# Patient Record
Sex: Female | Born: 1980 | Race: White | Hispanic: No | Smoking: Former smoker
Health system: Southern US, Community
[De-identification: ages and names within clinical notes are randomized; demographics above are authoritative.]

## PROBLEM LIST (undated history)

## (undated) ENCOUNTER — Inpatient Hospital Stay (HOSPITAL_COMMUNITY): Payer: Self-pay

## (undated) DIAGNOSIS — Z8619 Personal history of other infectious and parasitic diseases: Secondary | ICD-10-CM

## (undated) DIAGNOSIS — A499 Bacterial infection, unspecified: Secondary | ICD-10-CM

## (undated) DIAGNOSIS — F32A Depression, unspecified: Secondary | ICD-10-CM

## (undated) DIAGNOSIS — F329 Major depressive disorder, single episode, unspecified: Secondary | ICD-10-CM

## (undated) DIAGNOSIS — B009 Herpesviral infection, unspecified: Secondary | ICD-10-CM

## (undated) DIAGNOSIS — F4310 Post-traumatic stress disorder, unspecified: Secondary | ICD-10-CM

## (undated) DIAGNOSIS — B999 Unspecified infectious disease: Secondary | ICD-10-CM

## (undated) DIAGNOSIS — F419 Anxiety disorder, unspecified: Secondary | ICD-10-CM

## (undated) DIAGNOSIS — A599 Trichomoniasis, unspecified: Secondary | ICD-10-CM

## (undated) DIAGNOSIS — K8020 Calculus of gallbladder without cholecystitis without obstruction: Secondary | ICD-10-CM

## (undated) DIAGNOSIS — B379 Candidiasis, unspecified: Secondary | ICD-10-CM

## (undated) DIAGNOSIS — F319 Bipolar disorder, unspecified: Secondary | ICD-10-CM

## (undated) DIAGNOSIS — T7491XA Unspecified adult maltreatment, confirmed, initial encounter: Secondary | ICD-10-CM

## (undated) DIAGNOSIS — IMO0002 Reserved for concepts with insufficient information to code with codable children: Secondary | ICD-10-CM

## (undated) DIAGNOSIS — L309 Dermatitis, unspecified: Secondary | ICD-10-CM

## (undated) DIAGNOSIS — N390 Urinary tract infection, site not specified: Secondary | ICD-10-CM

## (undated) HISTORY — DX: Herpesviral infection, unspecified: B00.9

## (undated) HISTORY — DX: Unspecified infectious disease: B99.9

## (undated) HISTORY — PX: ECTOPIC PREGNANCY SURGERY: SHX613

## (undated) HISTORY — PX: CHOLECYSTECTOMY: SHX55

## (undated) HISTORY — DX: Unspecified adult maltreatment, confirmed, initial encounter: T74.91XA

## (undated) HISTORY — PX: COSMETIC SURGERY: SHX468

## (undated) HISTORY — DX: Trichomoniasis, unspecified: A59.9

## (undated) HISTORY — DX: Bacterial infection, unspecified: A49.9

## (undated) HISTORY — DX: Bipolar disorder, unspecified: F31.9

## (undated) HISTORY — DX: Post-traumatic stress disorder, unspecified: F43.10

## (undated) HISTORY — DX: Reserved for concepts with insufficient information to code with codable children: IMO0002

## (undated) HISTORY — DX: Calculus of gallbladder without cholecystitis without obstruction: K80.20

## (undated) HISTORY — DX: Urinary tract infection, site not specified: N39.0

## (undated) HISTORY — DX: Candidiasis, unspecified: B37.9

## (undated) HISTORY — PX: WISDOM TOOTH EXTRACTION: SHX21

## (undated) HISTORY — DX: Dermatitis, unspecified: L30.9

## (undated) HISTORY — DX: Anxiety disorder, unspecified: F41.9

## (undated) HISTORY — DX: Personal history of other infectious and parasitic diseases: Z86.19

## (undated) HISTORY — PX: OTHER SURGICAL HISTORY: SHX169

---
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
## 2008-04-20 ENCOUNTER — Ambulatory Visit (HOSPITAL_COMMUNITY): Admission: RE | Admit: 2008-04-20 | Discharge: 2008-04-20 | Payer: Self-pay | Admitting: Family Medicine

## 2008-05-11 ENCOUNTER — Ambulatory Visit (HOSPITAL_COMMUNITY): Admission: RE | Admit: 2008-05-11 | Discharge: 2008-05-11 | Payer: Self-pay | Admitting: Family Medicine

## 2008-05-26 ENCOUNTER — Ambulatory Visit (HOSPITAL_COMMUNITY): Admission: RE | Admit: 2008-05-26 | Discharge: 2008-05-26 | Payer: Self-pay | Admitting: Family Medicine

## 2008-06-05 ENCOUNTER — Inpatient Hospital Stay (HOSPITAL_COMMUNITY): Admission: AD | Admit: 2008-06-05 | Discharge: 2008-06-05 | Payer: Self-pay | Admitting: Obstetrics & Gynecology

## 2008-06-07 ENCOUNTER — Ambulatory Visit (HOSPITAL_COMMUNITY): Admission: RE | Admit: 2008-06-07 | Discharge: 2008-06-07 | Payer: Self-pay | Admitting: Obstetrics and Gynecology

## 2008-07-16 ENCOUNTER — Inpatient Hospital Stay (HOSPITAL_COMMUNITY): Admission: AD | Admit: 2008-07-16 | Discharge: 2008-07-16 | Payer: Self-pay | Admitting: Obstetrics and Gynecology

## 2008-08-30 ENCOUNTER — Inpatient Hospital Stay (HOSPITAL_COMMUNITY): Admission: AD | Admit: 2008-08-30 | Discharge: 2008-09-01 | Payer: Self-pay | Admitting: Obstetrics and Gynecology

## 2008-09-12 ENCOUNTER — Inpatient Hospital Stay (HOSPITAL_COMMUNITY): Admission: AD | Admit: 2008-09-12 | Discharge: 2008-09-15 | Payer: Self-pay | Admitting: Obstetrics and Gynecology

## 2008-11-02 ENCOUNTER — Inpatient Hospital Stay (HOSPITAL_COMMUNITY): Admission: AD | Admit: 2008-11-02 | Discharge: 2008-11-04 | Payer: Self-pay | Admitting: Obstetrics and Gynecology

## 2008-12-28 IMAGING — US US ABDOMEN COMPLETE
1 series · 14 of 25 positions shown · non-contrast
Comparison: None

CLINICAL DATA: Abdomen pain.  Gallstones.

ABDOMEN ULTRASOUND
TECHNIQUE: Complete abdominal ultrasound examination was performed
including evaluation of the liver, gallbladder, bile ducts,
pancreas, kidneys, spleen, IVC, and abdominal aorta.

[Series 1: unknown · 0.33mm/px · 14 of 70 slices shown]
[im 1/70]
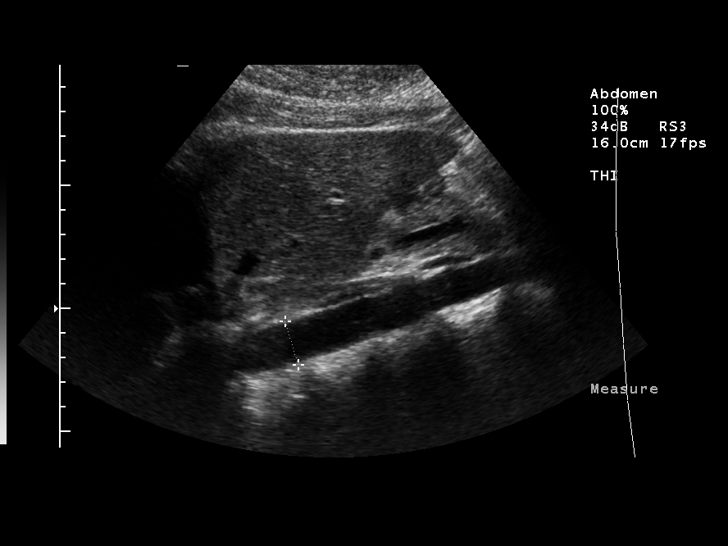
[im 6/70]
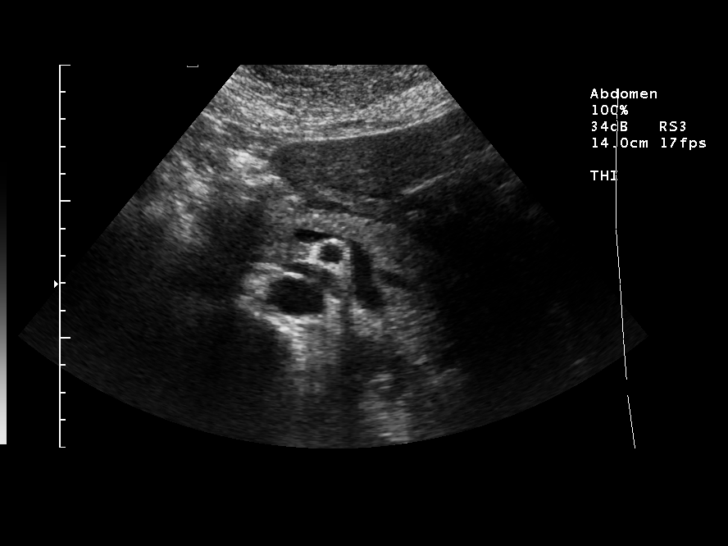
[im 12/70]
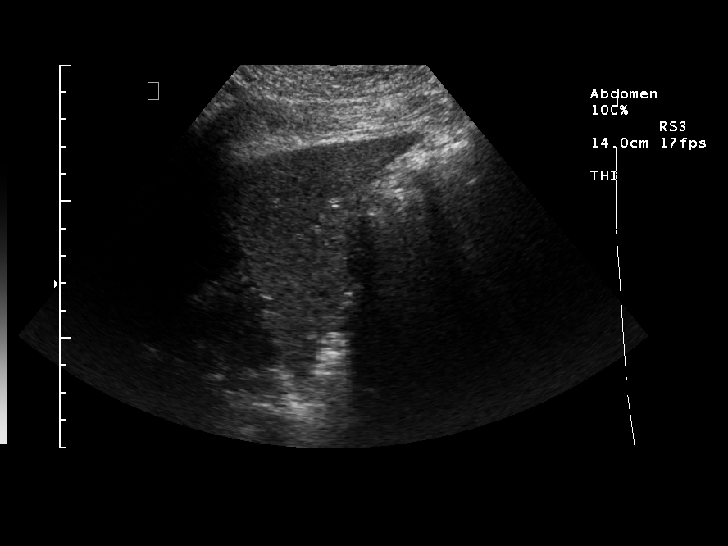
[im 18/70]
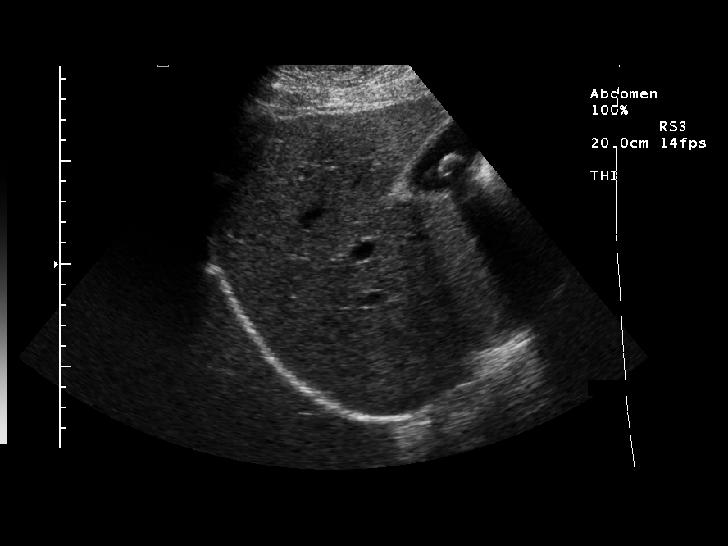
[im 24/70]
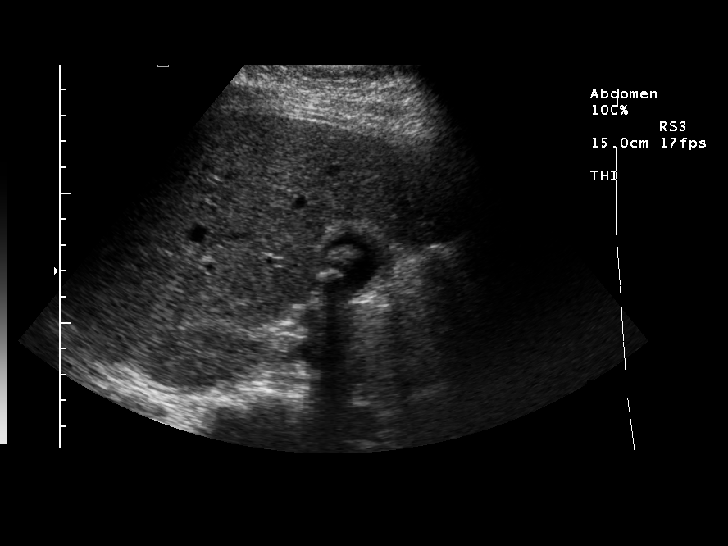
[im 26/70]
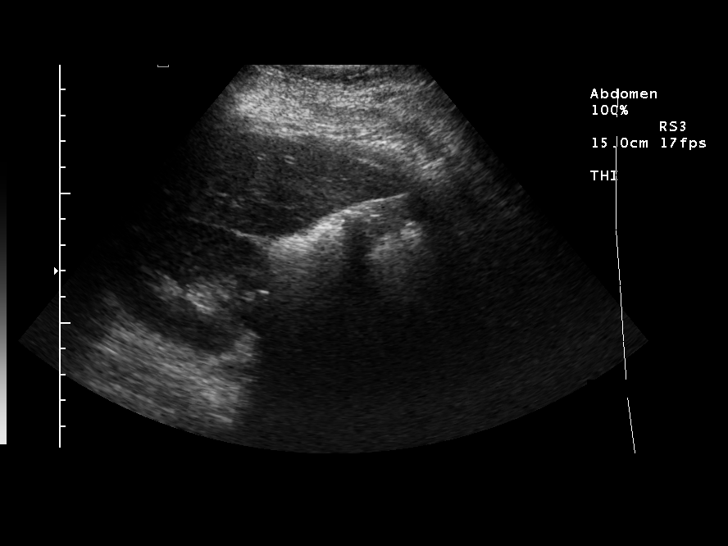
[im 32/70]
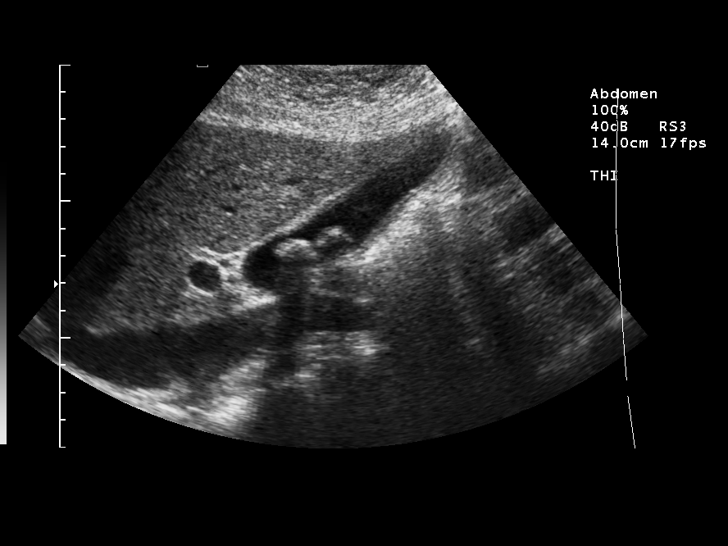
[im 38/70]
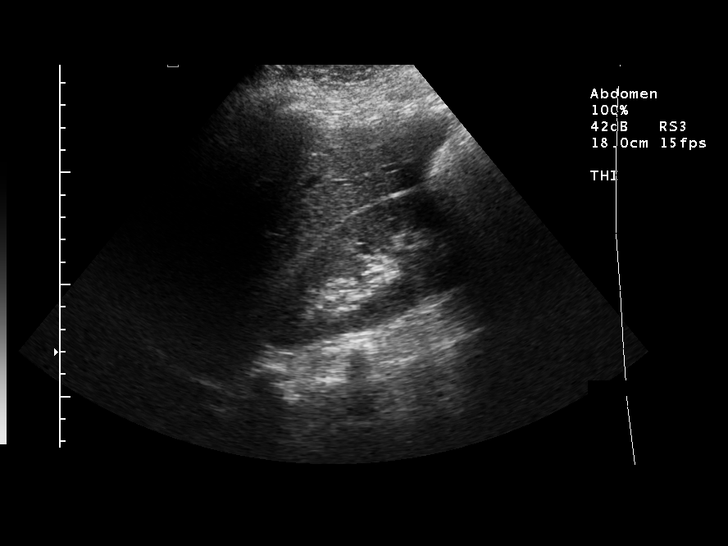
[im 44/70]
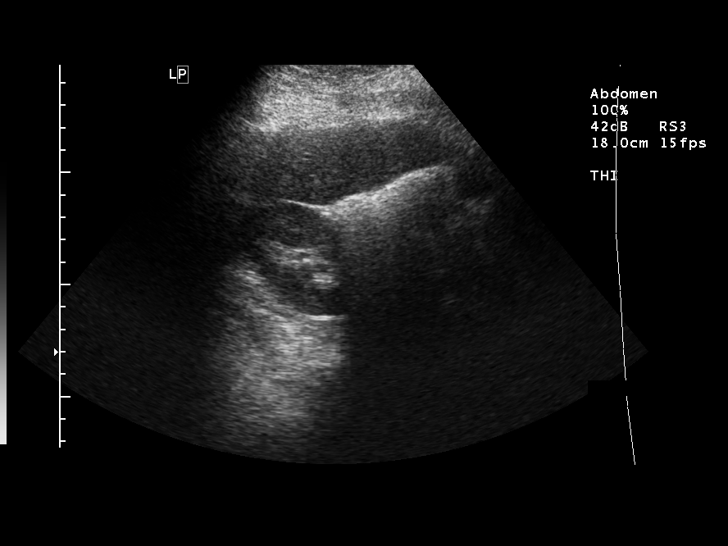
[im 47/70]
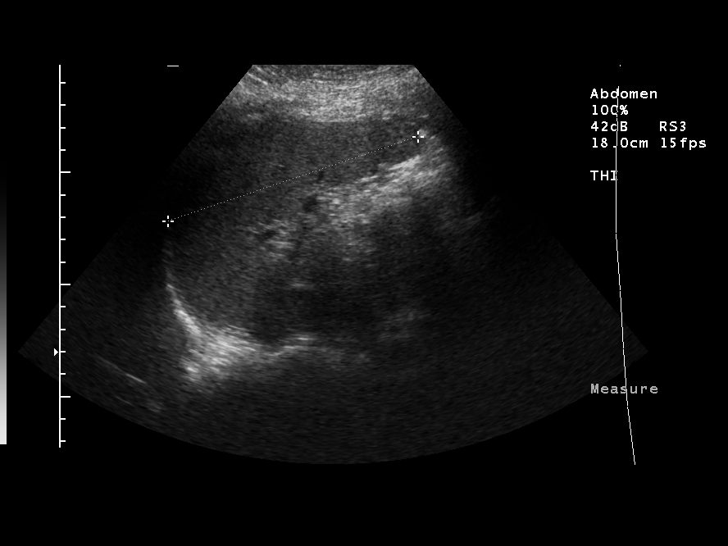
[im 52/70]
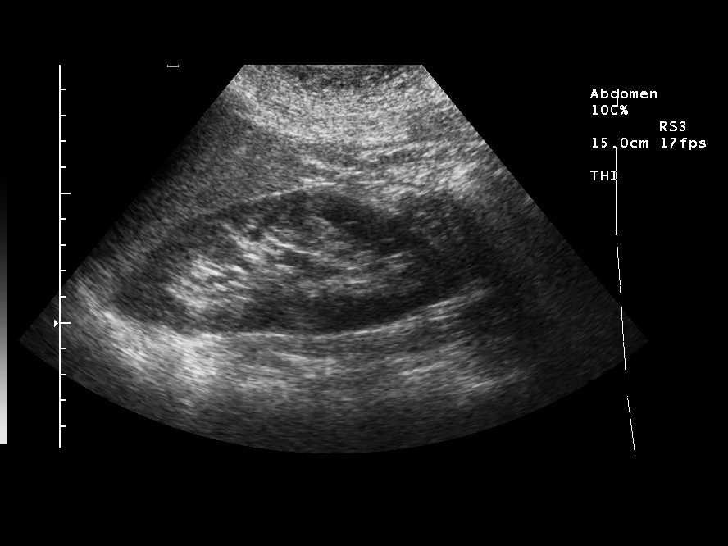
[im 58/70]
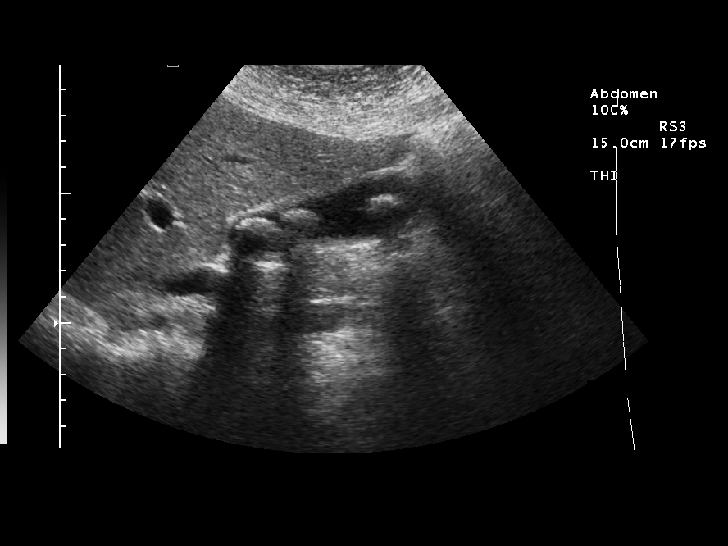
[im 64/70]
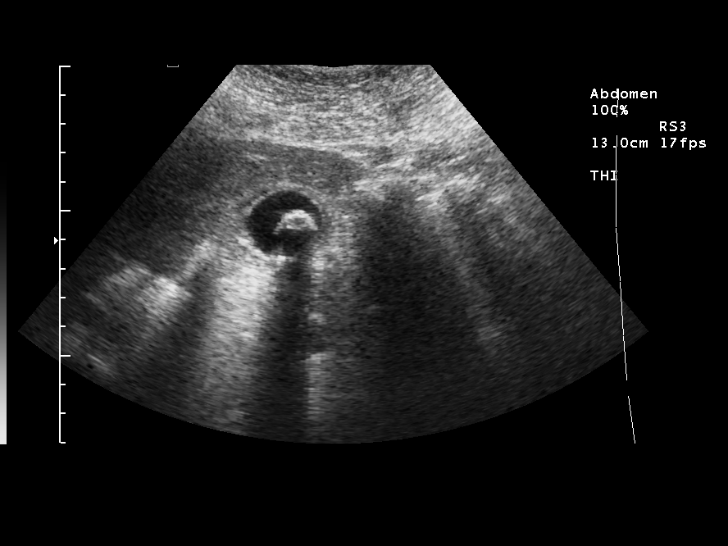
[im 70/70]
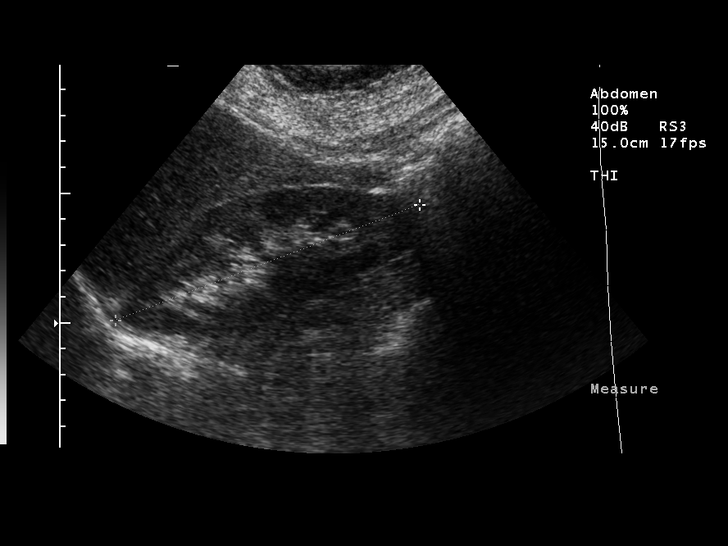

[14 of 25 positions shown; findings below may reference images not displayed]

FINDINGS: Multiple gallstones are present measuring up to 15 mm.
The gallbladder wall is mildly thickened at 4.4 mm.  There is no
pericholecystic fluid.  Common bile duct is normal at 3.0 mm.

The liver, IVC, pancreas, spleen, kidneys, and aorta are normal.
IMPRESSION: Multiple gallstones.  There is mild gallbladder wall thickening
which may indicate cholecystitis.  There is no biliary obstruction.

## 2009-03-22 IMAGING — US US ABDOMEN LIMITED
1 series · 14 of 22 positions shown · non-contrast
Comparison: [HOSPITAL] upper abdominal ultrasound
06/07/2008 and [HOSPITAL] obstetrical
ultrasound 05/26/2008.

CLINICAL DATA: Cholelithiasis.  30 weeks gestational age.

LIMITED ABDOMINAL ULTRASOUND - RIGHT UPPER QUADRANT

[Series 1: us abdomen limited · 14 of 22 slices shown]
[im 1/22]
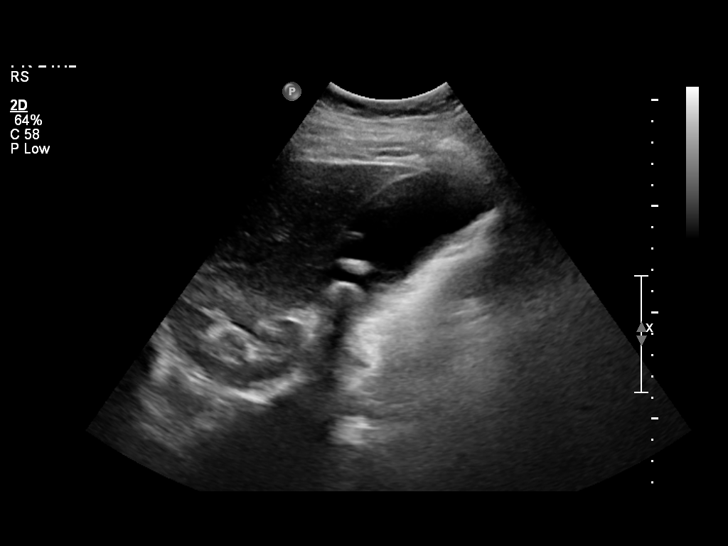
[im 3/22]
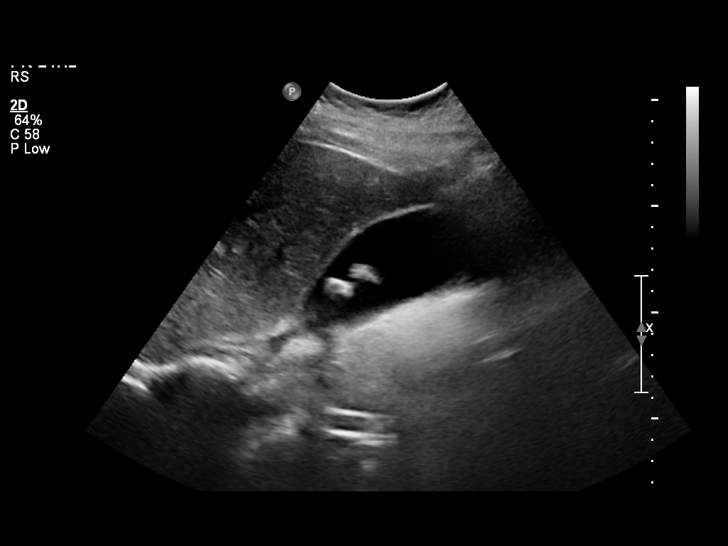
[im 4/22]
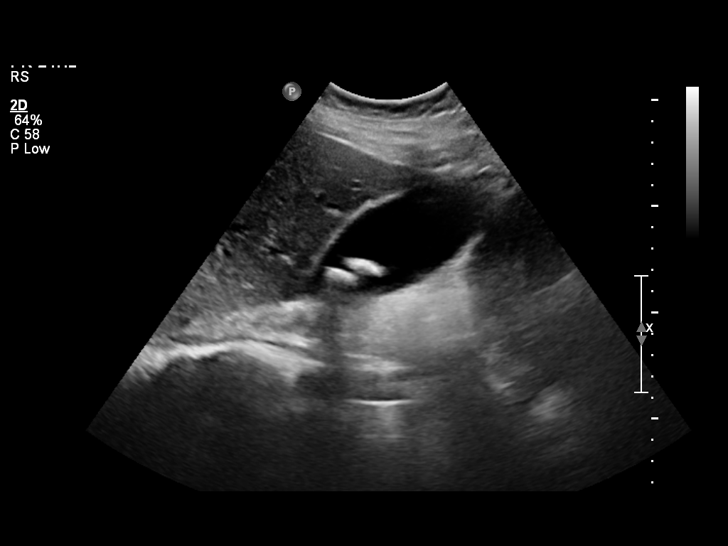
[im 6/22]
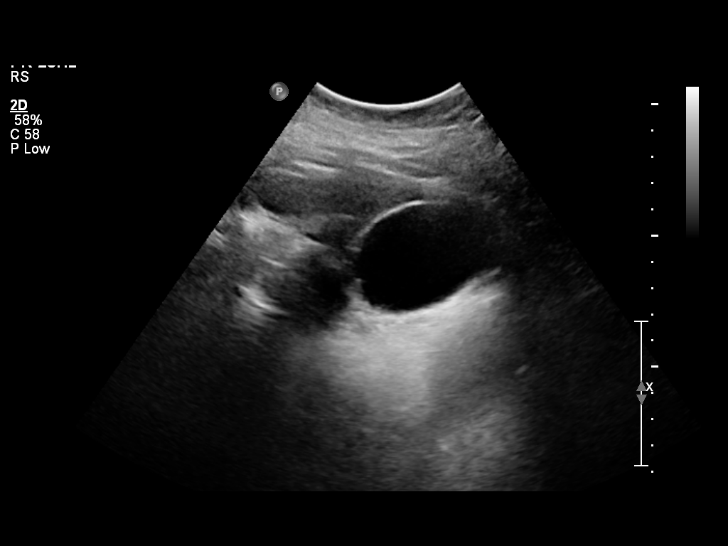
[im 8/22]
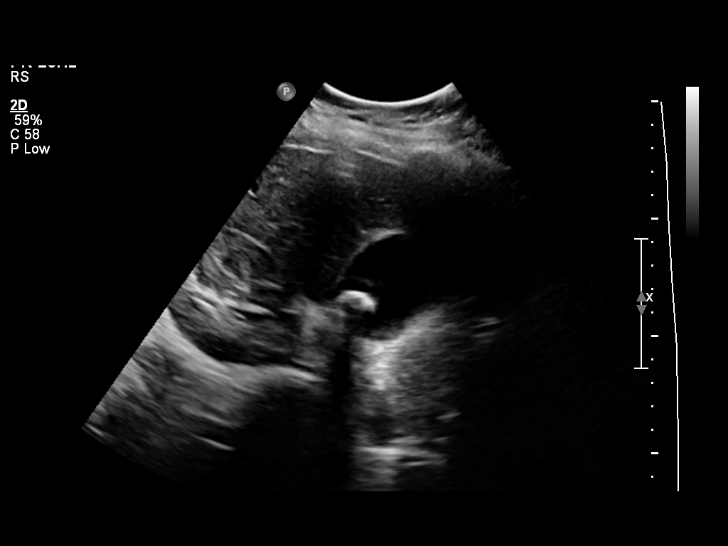
[im 9/22]
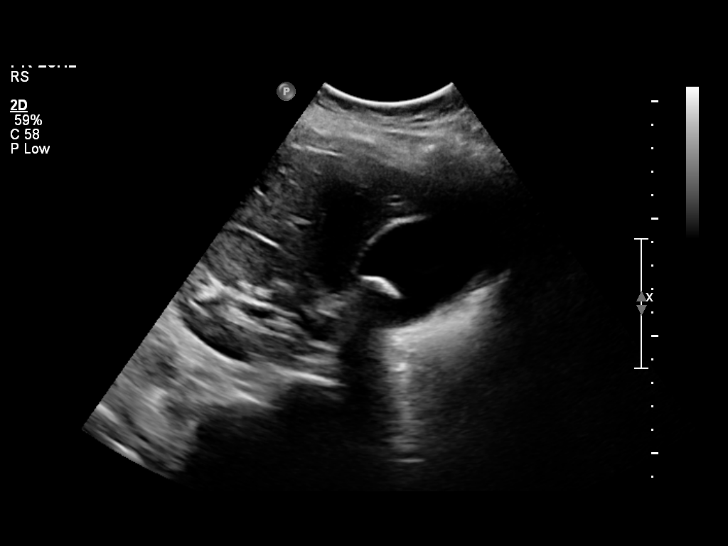
[im 11/22]
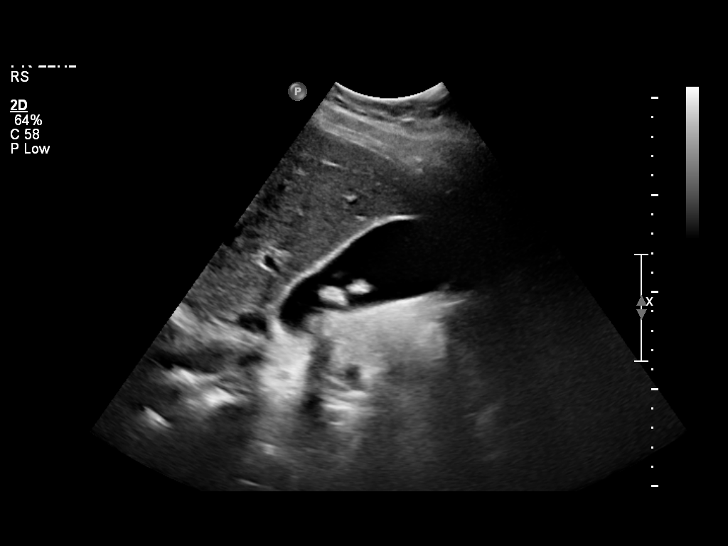
[im 12/22]
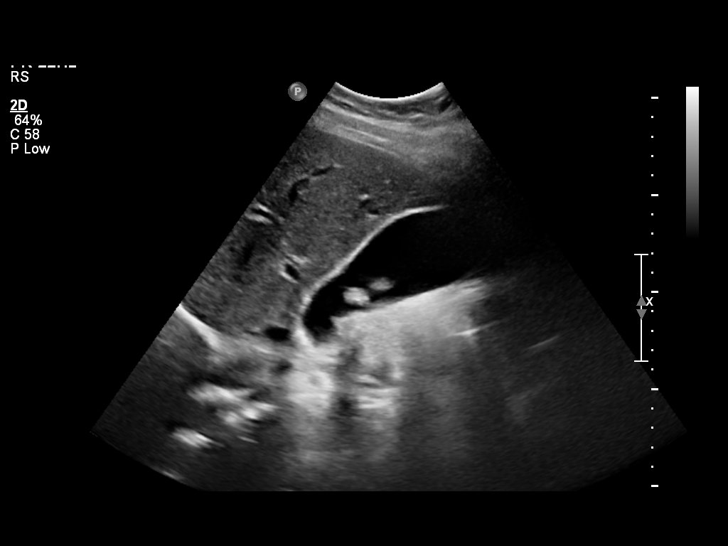
[im 14/22]
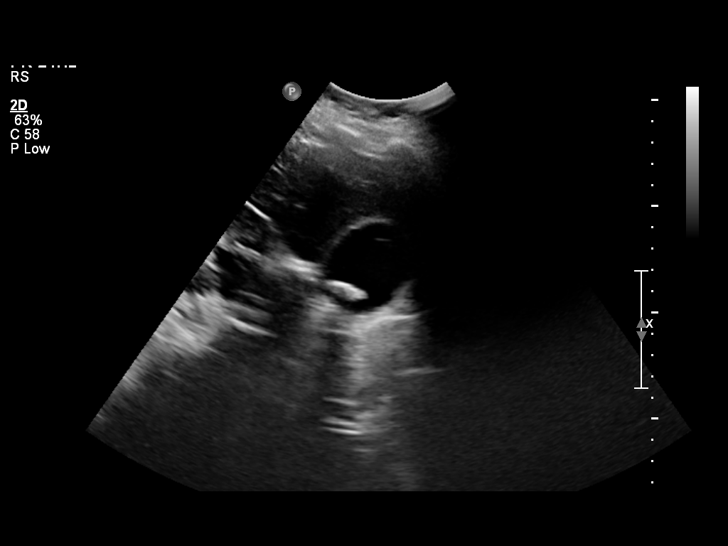
[im 15/22]
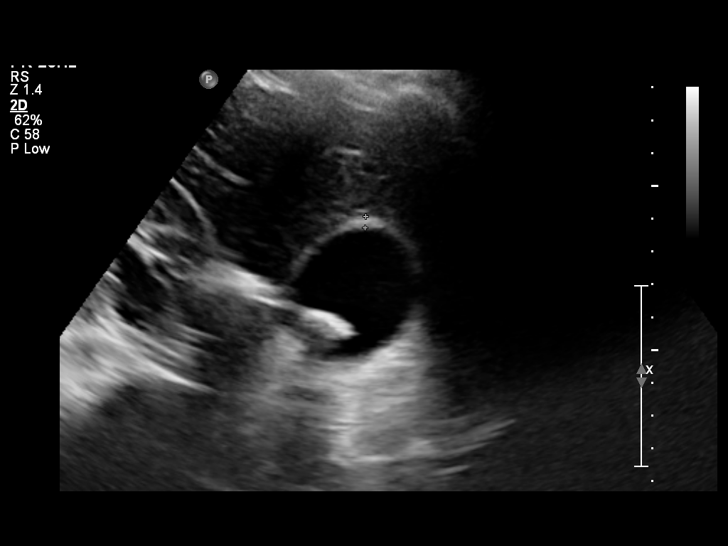
[im 17/22]
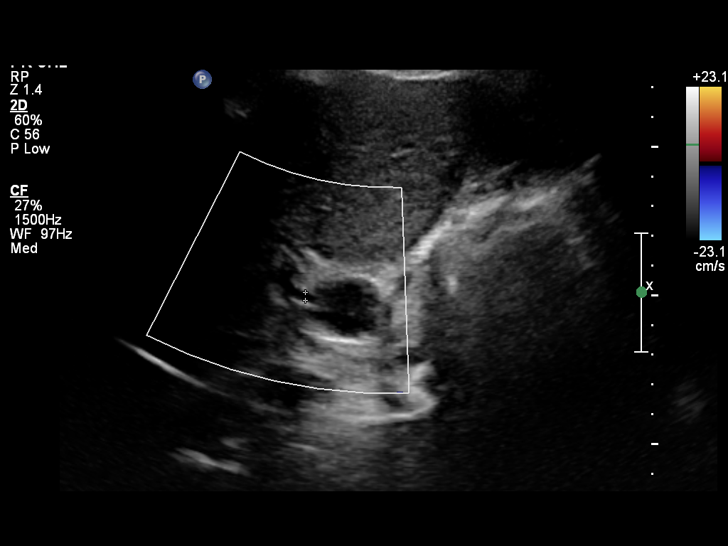
[im 19/22]
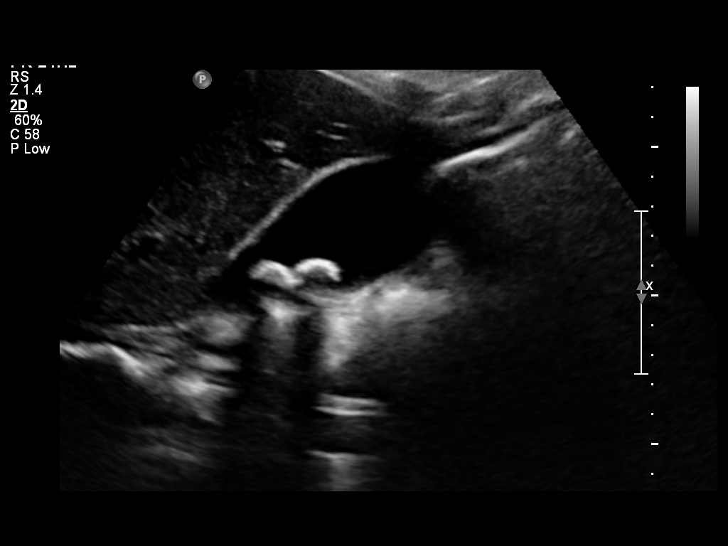
[im 20/22]
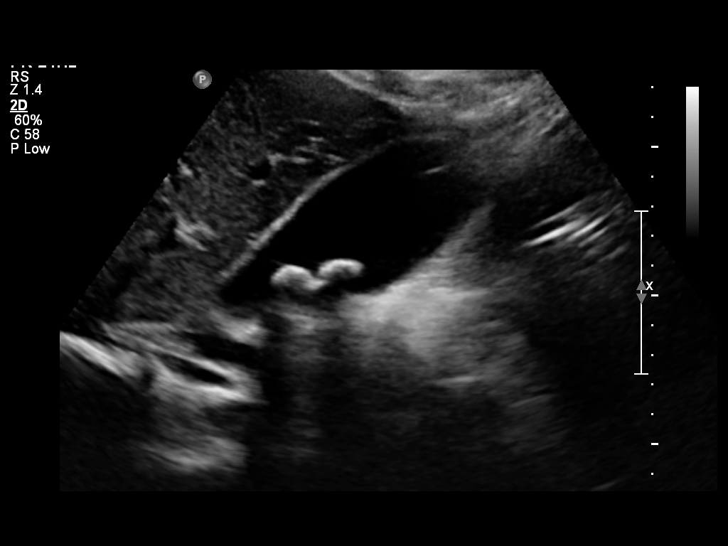
[im 22/22]
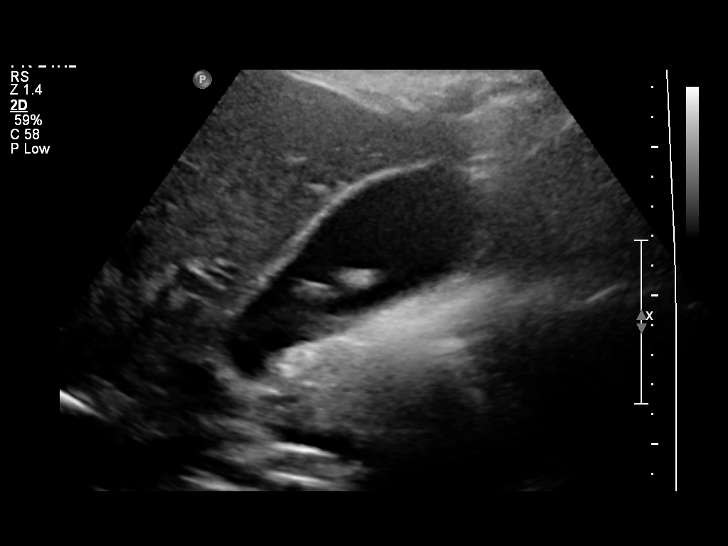

[14 of 22 positions shown; findings below may reference images not displayed]

FINDINGS: Gallbladder:  No change in 18 mm and less nonobstructing
gallstones.  Gallbladder wall measures upper limits of normal at
3.5 mm.  No pericholecystic fluid visualized.

Common bile duct:  No dilated intrahepatic or extrahepatic bile
ducts are seen with common bile duct measuring normally at 3 mm.

Liver:  No new focal liver lesions or perihepatic free fluid seen.
IMPRESSION: 1.  Currently nonobstructing cholelithiasis.
2.  No new significant abnormality.

## 2009-05-10 ENCOUNTER — Ambulatory Visit: Payer: Self-pay | Admitting: Family Medicine

## 2009-05-10 DIAGNOSIS — J029 Acute pharyngitis, unspecified: Secondary | ICD-10-CM | POA: Insufficient documentation

## 2009-05-10 DIAGNOSIS — M546 Pain in thoracic spine: Secondary | ICD-10-CM

## 2009-05-10 LAB — CONVERTED CEMR LAB: Rapid Strep: POSITIVE

## 2009-09-28 ENCOUNTER — Inpatient Hospital Stay (HOSPITAL_COMMUNITY): Admission: AD | Admit: 2009-09-28 | Discharge: 2009-09-28 | Payer: Self-pay | Admitting: Obstetrics and Gynecology

## 2009-09-30 ENCOUNTER — Inpatient Hospital Stay (HOSPITAL_COMMUNITY): Admission: AD | Admit: 2009-09-30 | Discharge: 2009-09-30 | Payer: Self-pay | Admitting: Obstetrics & Gynecology

## 2009-12-02 ENCOUNTER — Emergency Department (HOSPITAL_COMMUNITY): Admission: EM | Admit: 2009-12-02 | Discharge: 2009-12-02 | Payer: Self-pay | Admitting: Emergency Medicine

## 2010-09-04 LAB — HCG, QUANTITATIVE, PREGNANCY: hCG, Beta Chain, Quant, S: 472 m[IU]/mL — ABNORMAL HIGH (ref <5)

## 2010-09-05 LAB — URINALYSIS, ROUTINE W REFLEX MICROSCOPIC
Bilirubin Urine: NEGATIVE
Glucose, UA: NEGATIVE mg/dL
Ketones, ur: NEGATIVE mg/dL
Leukocytes, UA: NEGATIVE
Protein, ur: NEGATIVE mg/dL
Specific Gravity, Urine: >1.03 — ABNORMAL HIGH (ref 1.005–1.030)
Urobilinogen, UA: 0.2 mg/dL (ref 0.0–1.0)
pH: 5.5 (ref 5.0–8.0)

## 2010-09-05 LAB — GC/CHLAMYDIA PROBE AMP, GENITAL
Chlamydia, DNA Probe: NEGATIVE
GC Probe Amp, Genital: NEGATIVE

## 2010-09-05 LAB — HCG, QUANTITATIVE, PREGNANCY: hCG, Beta Chain, Quant, S: 3403 m[IU]/mL — ABNORMAL HIGH (ref <5)

## 2010-09-05 LAB — CBC
Hemoglobin: 12.1 g/dL (ref 12.0–15.0)
RBC: 4.12 MIL/uL (ref 3.87–5.11)
RDW: 14.2 % (ref 11.5–15.5)
WBC: 11.8 10*3/uL — ABNORMAL HIGH (ref 4.0–10.5)

## 2010-09-25 LAB — CBC
HCT: 29.5 % — ABNORMAL LOW (ref 36.0–46.0)
MCHC: 35.3 g/dL (ref 30.0–36.0)
MCHC: 36 g/dL (ref 30.0–36.0)
MCV: 92.1 fL (ref 78.0–100.0)
Platelets: 173 10*3/uL (ref 150–400)
RBC: 3.8 MIL/uL — ABNORMAL LOW (ref 3.87–5.11)
RDW: 13.5 % (ref 11.5–15.5)
WBC: 12.9 10*3/uL — ABNORMAL HIGH (ref 4.0–10.5)
WBC: 18.8 10*3/uL — ABNORMAL HIGH (ref 4.0–10.5)

## 2010-09-25 LAB — RPR: RPR Ser Ql: NONREACTIVE

## 2010-09-27 LAB — CBC
HCT: 28.7 % — ABNORMAL LOW (ref 36.0–46.0)
HCT: 34.1 % — ABNORMAL LOW (ref 36.0–46.0)
Hemoglobin: 10.2 g/dL — ABNORMAL LOW (ref 12.0–15.0)
Hemoglobin: 9.9 g/dL — ABNORMAL LOW (ref 12.0–15.0)
MCHC: 34.9 g/dL (ref 30.0–36.0)
MCV: 92.3 fL (ref 78.0–100.0)
MCV: 93.7 fL (ref 78.0–100.0)
Platelets: 204 10*3/uL (ref 150–400)
RBC: 3.68 MIL/uL — ABNORMAL LOW (ref 3.87–5.11)
RBC: 3.72 MIL/uL — ABNORMAL LOW (ref 3.87–5.11)
RDW: 12.4 % (ref 11.5–15.5)
RDW: 12.6 % (ref 11.5–15.5)
WBC: 18.3 10*3/uL — ABNORMAL HIGH (ref 4.0–10.5)
WBC: 19.3 10*3/uL — ABNORMAL HIGH (ref 4.0–10.5)

## 2010-09-27 LAB — DIFFERENTIAL
Basophils Absolute: 0 10*3/uL (ref 0.0–0.1)
Basophils Absolute: 0 10*3/uL (ref 0.0–0.1)
Basophils Relative: 0 % (ref 0–1)
Basophils Relative: 0 % (ref 0–1)
Eosinophils Absolute: 0 10*3/uL (ref 0.0–0.7)
Eosinophils Absolute: 0 10*3/uL (ref 0.0–0.7)
Eosinophils Relative: 0 % (ref 0–5)
Lymphocytes Relative: 10 % — ABNORMAL LOW (ref 12–46)
Lymphs Abs: 0.6 10*3/uL — ABNORMAL LOW (ref 0.7–4.0)
Lymphs Abs: 1.2 10*3/uL (ref 0.7–4.0)
Lymphs Abs: 1.6 10*3/uL (ref 0.7–4.0)
Monocytes Absolute: 0.4 10*3/uL (ref 0.1–1.0)
Monocytes Relative: 7 % (ref 3–12)
Neutro Abs: 12.8 10*3/uL — ABNORMAL HIGH (ref 1.7–7.7)
Neutro Abs: 12.8 10*3/uL — ABNORMAL HIGH (ref 1.7–7.7)
Neutro Abs: 18.3 10*3/uL — ABNORMAL HIGH (ref 1.7–7.7)
Neutrophils Relative %: 83 % — ABNORMAL HIGH (ref 43–77)
Neutrophils Relative %: 84 % — ABNORMAL HIGH (ref 43–77)

## 2010-09-27 LAB — COMPREHENSIVE METABOLIC PANEL
ALT: 30 U/L (ref 0–35)
ALT: 36 U/L — ABNORMAL HIGH (ref 0–35)
AST: 22 U/L (ref 0–37)
AST: 74 U/L — ABNORMAL HIGH (ref 0–37)
Albumin: 3.3 g/dL — ABNORMAL LOW (ref 3.5–5.2)
Alkaline Phosphatase: 49 U/L (ref 39–117)
Alkaline Phosphatase: 56 U/L (ref 39–117)
BUN: 1 mg/dL — ABNORMAL LOW (ref 6–23)
BUN: 2 mg/dL — ABNORMAL LOW (ref 6–23)
CO2: 25 mEq/L (ref 19–32)
Calcium: 8.7 mg/dL (ref 8.4–10.5)
Chloride: 102 mEq/L (ref 96–112)
Chloride: 105 mEq/L (ref 96–112)
Creatinine, Ser: 0.43 mg/dL (ref 0.4–1.2)
GFR calc Af Amer: >60 mL/min (ref >60)
GFR calc non Af Amer: >60 mL/min (ref >60)
GFR calc non Af Amer: >60 mL/min (ref >60)
Glucose, Bld: 83 mg/dL (ref 70–99)
Glucose, Bld: 96 mg/dL (ref 70–99)
Potassium: 3.3 mEq/L — ABNORMAL LOW (ref 3.5–5.1)
Potassium: 3.8 mEq/L (ref 3.5–5.1)
Sodium: 134 mEq/L — ABNORMAL LOW (ref 135–145)
Sodium: 135 mEq/L (ref 135–145)
Sodium: 136 mEq/L (ref 135–145)
Total Bilirubin: 0.7 mg/dL (ref 0.3–1.2)
Total Bilirubin: 0.8 mg/dL (ref 0.3–1.2)
Total Bilirubin: 0.9 mg/dL (ref 0.3–1.2)
Total Protein: 5 g/dL — ABNORMAL LOW (ref 6.0–8.3)
Total Protein: 5.1 g/dL — ABNORMAL LOW (ref 6.0–8.3)
Total Protein: 6 g/dL (ref 6.0–8.3)

## 2010-09-27 LAB — URINALYSIS, ROUTINE W REFLEX MICROSCOPIC
Bilirubin Urine: NEGATIVE
Glucose, UA: NEGATIVE mg/dL
Hgb urine dipstick: NEGATIVE
Hgb urine dipstick: NEGATIVE
Ketones, ur: 15 mg/dL — AB
Ketones, ur: NEGATIVE mg/dL
Nitrite: NEGATIVE
pH: 5.5 (ref 5.0–8.0)
pH: 6.5 (ref 5.0–8.0)

## 2010-09-27 LAB — LIPASE, BLOOD: Lipase: 21 U/L (ref 11–59)

## 2010-09-27 LAB — FETAL FIBRONECTIN: Fetal Fibronectin: NEGATIVE

## 2010-10-01 LAB — DIFFERENTIAL
Basophils Absolute: 0.1 10*3/uL (ref 0.0–0.1)
Basophils Relative: 0 % (ref 0–1)
Eosinophils Relative: 0 % (ref 0–5)
Lymphocytes Relative: 8 % — ABNORMAL LOW (ref 12–46)
Monocytes Absolute: 0.5 10*3/uL (ref 0.1–1.0)
Neutro Abs: 12.4 10*3/uL — ABNORMAL HIGH (ref 1.7–7.7)

## 2010-10-01 LAB — COMPREHENSIVE METABOLIC PANEL
AST: 17 U/L (ref 0–37)
Albumin: 3.2 g/dL — ABNORMAL LOW (ref 3.5–5.2)
Alkaline Phosphatase: 29 U/L — ABNORMAL LOW (ref 39–117)
BUN: 5 mg/dL — ABNORMAL LOW (ref 6–23)
CO2: 28 mEq/L (ref 19–32)
Chloride: 104 mEq/L (ref 96–112)
Creatinine, Ser: 0.45 mg/dL (ref 0.4–1.2)
GFR calc non Af Amer: >60 mL/min (ref >60)
Potassium: 3.5 mEq/L (ref 3.5–5.1)
Total Bilirubin: 0.3 mg/dL (ref 0.3–1.2)

## 2010-10-01 LAB — CBC
HCT: 31.8 % — ABNORMAL LOW (ref 36.0–46.0)
MCV: 92.9 fL (ref 78.0–100.0)
RBC: 3.43 MIL/uL — ABNORMAL LOW (ref 3.87–5.11)
WBC: 14.1 10*3/uL — ABNORMAL HIGH (ref 4.0–10.5)
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
## 2010-10-30 NOTE — Discharge Summary (Signed)
[name], PANGLE NO.:  [id number]   MEDICAL RECORD NO.:  [id number]          PATIENT TYPE:  INP   LOCATION:  9155                          [facility]   PHYSICIAN:  Janine Limbo, M.D.DATE OF BIRTH:  [birth year]   DATE OF ADMISSION:  09/12/2008  DATE OF DISCHARGE:  09/15/2008                               DISCHARGE SUMMARY   ADMITTING DIAGNOSES:  1. Intrauterine pregnancy at 33-4/7th weeks.  2. Cholelithiasis.  3. Intermittent uterine activity.   DISCHARGE DIAGNOSES:  1. Intrauterine pregnancy at 34 weeks.  2. Stable cholelithiasis.  3. Stable preterm labor.   PROCEDURES:  None.   HOSPITAL COURSE:  [name] is a 30 year old gravida 1, para 0 at 74-  4/7th weeks, who was admitted on the evening of September 12, 2008,  complaining of worsening right upper quadrant and epigastric pain.  She  did have a history of known gallstones and she had been admitted to the  hospital from March 16 to March 18 for IV pain medications and hydration  with an acute attack.  She went home on the 18th.  The patient had been  on 1 Vicodin p.o. q.4 with increasing pain despite taking more  medication.  Pregnancy has been remarkable for;  1. Positive HSV-2.  2. Migraines.  3. Second trimester STDs.   On admission, the patient was afebrile.  Blood pressure was within  normal limits.  Pulse was normal.  White blood cell count was 18.3 with  an 89% neutrophil count, SGOT was 22, and SGPT was 30.  Urinalysis was  negative.  In light of the patient's abdominal pain and known  gallstones, she was admitted for IV hydration.  She had been given the  option of IM Stadol and Phenergan and discharged home or the option of  admission for IV pain control.  The patient preferred to be admitted.  She was also feeling some lower abdominal cramping.  By the morning of  March 30, she was still having lower abdominal pain.  Fetal heart rate  was reactive.  She was having some  irritability.  Fetal fibronectin was  done, which was negative.  Her cervix was 1 cm, 50%, vertex -2.  White  blood cell count was down to 15.2, neutrophils were 83, SGOT and SGPT  were normal.  The patient was placed on Procardia 10 mg, this seemed to  be night of March 30 to March 31, the patient reported having a bad  night with inadequate pain management with her p.o. pain medication  regimen.  By that morning, she was having some irritability, had some  uterine contractions q.4.  In the late evening, she was placed on  Procardia q.6, not p.r.n.  Fetal heart rate was reactive.  Plan was made  for Dr. Pennie Rushing to see the patient.  During that time, Dr. Pennie Rushing did  have a discussion with the patient that she was hoping to discharge the  patient soon and in our discussions with the patient, the plan was made  for her regimen of pain medication and that the patient would take an  Ambien to help her sleep.  By the morning of April 1, the patient was  doing much better.  She had been able to manage her pain without any  difficulty during the night with a q.4-hour regimen of Vicodin.  She is  having minimal cramping.  Fetus was reactive.  She was having no nausea  and she was tolerating a bland low-fat diet.  She did report that she  was ready to go home.  She was discharged home in good condition.   DISCHARGE INSTRUCTIONS:  The patient is to monitor for signs and  symptoms of preterm labor as well as any worsening of the  cholelithiasis.  She will maintain a bland low-fat diet.  She will be on  pelvic rest.   DISCHARGE MEDICATIONS:  1. Protonix 40 mg p.o. daily.  2. Procardia 10 mg p.o. q.6 h. p.r.n. cramping.  3. Valtrex 500 mg p.o. daily for HSV prophylaxis.  4. Vicodin 1-2 p.o. q.4 h. p.r.n. pain.  5. The patient will continue her prenatal vitamins and she will use      antiemetics as previously prescribed as needed.   DISCHARGE FOLLOWUP:  Will occur on September 22, 2008, at [facility]  or p.r.n.      Renaldo Reel Emilee Hero, C.N.M.      Janine Limbo, M.D.  Electronically Signed    VLL/MEDQ  D:  09/15/2008  T:  09/15/2008  Job:  [id number]

## 2010-10-30 NOTE — Consult Note (Signed)
NAMEKATLYNNE, [name]:  [id number]   MEDICAL RECORD NO.:  [id number]          PATIENT TYPE:  INP   LOCATION:  9156                          [facility]   PHYSICIAN:  Almond Lint, MD       DATE OF BIRTH:  [birth year]   DATE OF CONSULTATION:  08/31/2008  DATE OF DISCHARGE:  09/01/2008                                 CONSULTATION   REQUESTING PHYSICIAN:  Hal Morales, MD   CHIEF COMPLAINT:  Epigastric pain and cholelithiasis.   HISTORY OF PRESENT ILLNESS:  [name] is a 30 year old female whom I  saw in clinic approximately 3-4 weeks ago for symptomatic  cholelithiasis.  At that point, she was having occasional bouts of pain,  but felt like they were consistent with symptoms that she has been  having on and off for the last 2-3 years.  She described nausea and  vomiting as well as a right upper quadrant pain after eating.  She has  been on Protonix since I saw her in clinic and did have some improvement  in her symptoms particularly the pain.  However, she presented to the  emergency department on August 30, 2008, at the [facility] with  severe pain 10/10 with nausea, vomiting, and inability to tolerate p.o.  She states that was so bad, she felt like someone was stabbing her with  a knife.  She has been attempting to keep a low-fat diet, however, had  some fatty food in the last 24 hours and felt like this may have  exacerbated her pain.  In terms of her pregnancy, she has had bacterial  vaginosis with Trichomonas as well as migraines.  She has had a history  of HSV exposure and tobacco and marijuana use in the first trimester.   PAST SURGICAL HISTORY:  Positive for wrist surgery and wisdom teeth  removal.   FAMILY HISTORY:  She has a grandfather with coronary artery disease,  separate grandparents had diabetes, one paternal grandmother had thyroid  disease, maternal grandmother had ovarian cancer, paternal grandfather  had Hodgkin  disease.   SOCIAL HISTORY:  She is single and is accompanied by her parents.  She  has substance abuse history, but has not used alcohol.   ALLERGIES:  She is allergic to SULFA and BIAXIN.   PHYSICAL EXAMINATION:  VITAL SIGNS:  T-max was 99.  Vitals are stable.  GENERAL:  She is alert and oriented x3 and appears comfortable on my  exam.  HEENT:  Normocephalic and atraumatic.  Pupils equal, round, and reactive  to light.  LUNGS:  Clear.  HEART:  Regular.  ABDOMEN:  Soft, nondistended, and gravid.  There is some epigastric  pain, but no tenderness.  EXTREMITIES:  Warm and well perfused with no edema.   LABORATORY DATA:  White count 19.3 and hematocrit 34.1.  LFTs, there is  mildly elevated AST and ALT at 74 and 36, otherwise LFTs are normal.  Creatinine is 0.39.  Repeat right upper quadrant ultrasound was  unchanged demonstrating upper limit of normal gallbladder wall with no  pericholecystic fluid and nonobstructing  gallstones.  No ductal  dilatation.   ASSESSMENT:  [name] is a 30 year old female at 47 and 5/7th  gestation with symptomatic cholelithiasis.  Ideally, we will get her  through this pregnancy and take out her gallbladder electively.  I  advised her that some patients have symptoms that resolved after  pregnancy has ended, but she states that she has had symptoms for the  last several years and would like to go ahead and undergo  cholecystectomy postpartum.  We would especially like to get her to a  higher gestational age at this point such that if she were to undergo a  surgery and have preterm labor, the baby would have lesser chances of  complication.  At this time, the plan is to advance her diet as  tolerated, treat her nausea and pain and she was advised to stick very  strictly to low-fat diet.  She will come back to see me on a p.r.n.  basis during her pregnancy and then call after the baby is born and we  will schedule her for an elective lap chole.   Should she have other  issues during pregnancy, please notify my office or myself and we may  need to revise her plan if her symptoms worsen.      Almond Lint, MD  Electronically Signed     FB/MEDQ  D:  09/01/2008  T:  09/02/2008  Job:  [id number]

## 2010-10-30 NOTE — Discharge Summary (Signed)
[name], ROUILLARD NO.:  [id number]   MEDICAL RECORD NO.:  [id number]          PATIENT TYPE:  INP   LOCATION:  9156                          [facility]   PHYSICIAN:  Osborn Coho, M.D.   DATE OF BIRTH:  [birth year]   DATE OF ADMISSION:  08/30/2008  DATE OF DISCHARGE:  09/01/2008                               DISCHARGE SUMMARY   ADMITTING DIAGNOSES:  1. Intrauterine pregnancy at 31-4/7 weeks.  2. Questionable acute cholecystitis.   DISCHARGE DIAGNOSES:  1. Intrauterine pregnancy at 31-6/7 weeks.  2. Improved cholelithiasis.   PROCEDURE:  IV hydration.   HOSPITAL COURSE:  Ms. [name] is a 30 year old gravida 1, para 0 at 22-  4/7 weeks who presented complaining of a gallbladder attack on the  afternoon of August 30, 2008.  Her pain and nausea had begun at  approximately 7 a.m.  She was known for multiple gallstones diagnosed in  December, 2009.  She had over the last 24 hours had intake of hamburger,  chicken wrap and Pringles.  She was having epigastric and right upper  quadrant pain to a 10/10 level, chills, and nausea and vomiting.  Her  last gallbladder scan had been on June 07, 2008 and had shown  multiple gallstones.  She had had a MAU visit subsequent to that in  February 2010 for exacerbation.  She was then sent to [facility] for a consult with Dr. Donell Beers.  The decision was made to  anticipate planning to do a cholecystectomy postpartum.  The patient had  been managed at home on antiemetics.  She had been given a prescription  for Vicodin, but had not taken any.  She had been fairly stable with her  gallstones until the morning of date of admission on August 30, 2008.   The patient's pregnancy had been remarkable for:  1. Transfer in from [facility] Department at 18 weeks.  2. Second trimester Chlamydia and Trichomonas.  3. Migraines.  4. History of tobacco and marijuana use prior to pregnancy.  5. History of HSV  exposure with positive HSV-2 by titer.  6. Gallstones.   On admission, the patient was noted to have a temp of 97.7, blood  pressure and other vital signs were normal.  She did appear ill.  Fetal  heart rate was reactive.  She was having some occasional mild  irritability and had definitely epigastric and right upper quadrant  pain.  She had negative peritoneal signs when evaluated by Dr. Pennie Rushing.  Her fetal fibronectin was done and was negative.  Uterine contractions  were noted to be some irritability.  CBC on admission:  Hemoglobin was  11.6, white blood cell count was 19.3.  Comprehensive metabolic panel  showed SGOT of 74 and SGPT of 36.  Amylase and lipase were normal.  She  was, therefore,  admitted for IV pain medication with a Dilaudid PCA, IV  hydration, antiemetic therapy with Zofran 4 mg. and then given Zofran  every 8 hours IV, and also placed on Zosyn after consult with Dr.  Claud Kelp as the surgery consultant.  A gallbladder ultrasound was  repeated on the evening of August 30, 2008, showing currently  nonobstructing cholelithiasis and no new significant abnormality.  Over  the next 24 hours, the patient began to feel better.  She was tolerating  some clear liquids.  She was placed n.p.o. after midnight on August 31, 2008 in anticipation of general surgery evaluation that day.  CBC was  repeated, showing a hemoglobin of 9.9, white blood cell count of 15.2,  SGOT of 74 and SGPT of 61.  Urinalysis showed specific gravity greater  than 1.030, small bilirubin and 15 of ketones.  Dr. Donell Beers saw the  patient early afternoon of August 31, 2008.  Her recommendation was to  anticipate discharging her home if she was able to begin to tolerate a  low-fat diet and to hope to delay the cholecystectomy until postpartum.  The patient was begun on a low-fat diet on the evening of August 31, 2008, and a nutritional consult was obtained for dietary instruction.  Protonix was also  restarted on the patient.   By the morning of September 01, 2008, Dr. Donell Beers had been in to see the  patient.  She felt that she was better and was approving of a discharge  home that day.  The patient was having still some mild epigastric pain.  She was having no nausea.  She was still at that time on Dilaudid PCA,  Zosyn IV antibiotic and Zofran IV antiemetic.  The Dilaudid PCA was  stopped, the antiemetic was converted to p.o., and the Zosyn course was  completed.  The patient was noted to be having a reactive fetal heart  rate tracing.  She had some sporadic runs of irritability.  However, she  had had a negative fetal fibronectin on the day of admission, and her  cervix had been closed and long at that time.  Dr. Su Hilt was  consulted, and the decision was made to discharge the patient home in  light of her improved comfort status as well as her ability to tolerate  a low-fat diet without active increase in her pain or nausea and  vomiting.  The patient was deemed to have received full benefit  pertinent of her hospital stay and was discharged home.   DISCHARGE INSTRUCTIONS:  The patient is to strictly maintain a low-fat  bland diet.  She is to take p.o. pain medication as needed and her  antiemetics.  She will also continue her Protonix.  She will monitor for  signs and symptoms of preterm labor.  She will maintain increased rest  and pelvic rest.   DISCHARGE MEDICATIONS:  1. Augmentin 875 mg 1 p.o. b.i.d. x7 days per recommendation of Dr.      Donell Beers.  2. Vicodin 1 p.o. q.3-4 h p.r.n. pain.  3. Zofran 8 mg ODT 1 p.o. q.8 h p.r.n. nausea.  4. Protonix 40 mg 1 p.o. daily/  5. Prenatal vitamin 1 p.o. daily.   Discharge followup will occur as scheduled on September 08, 2008 at [facility] for her regular visit.  A CBC with differential will be  performed that day.  If the CBC is within normal limits, then a plan  will be made for 2 doses of outpatient administration of  betamethasone.  The patient will follow up with Dr. Donell Beers postpartum or as needed prior  to that time with any exacerbation of her symptoms.      Renaldo Reel Emilee Hero, C.N.M.  Osborn Coho, M.D.  Electronically Signed    VLL/MEDQ  D:  09/01/2008  T:  09/01/2008  Job:  [id number]   cc:   Attention:  Dr. Mickle [facility]

## 2010-11-02 NOTE — Op Note (Signed)
[place]  Patient:    Mallory [name], Mallory [name] Visit Number: [id number] MRN: [id number]          Service Type: DSU Location: [facility] Attending Physician:  Ronne Binning Dictated by:   Nicki Reaper, M.D. Proc. Date: 11/10/01 Admit Date:  11/11/2001                             Operative Report  PREOPERATIVE DIAGNOSIS:  Injury, right wrist, with partial scapholunate ligament tear, questionable triangular fibrocartilage complex tear.  POSTOPERATIVE DIAGNOSIS:  Injury, right wrist, with partial scapholunate ligament tear, questionable triangular fibrocartilage complex tear.  OPERATION:  Arthroscopy, shrinkage of scapholunate ligament tear, debridement of triangular fibrocartilage complex tear with dorsal capsular shrinkage, right wrist.  SURGEON:  Nicki Reaper, M.D.  ASSISTANT:  Joaquin Courts, R.N.  ANESTHESIA:  Axillary block.  HISTORY:  The patient is [name] 30 year old female with [name] history of wrist pain to her right wrist.  MRI reveals [name] partial scapholunate ligament tear.  Motion study reveals that there is widening.  DESCRIPTION OF PROCEDURE:  The patient was brought to the operating room where an axillary block was carried out without difficulty.  She was prepped and draped using Betadine scrubbing solution with right arm free.  The limb was placed in the arthroscopy tower, 10 pounds of traction applied, joint inflated through the 3.4 portal.  [name] transverse incision was made and deepened with [name] hemostat.  Blunt trocar was used to enter the joint.  The joint was inspected. An irrigation catheter was placed in 6-U using an 18-gauge needle.  The volar/radial wrist ligaments were intact.  The cartilage was intact on the proximal aspect of the carpus and the distal aspect of the radius.  The TFCC was found to be intact but was depressed secondary to the pressure.  [name] 4.5 portal was opened after localization with [name] 22-gauge needle, the portal opened  similar to the 3.4, [name] probe entered.  The TFCC showed an intact peripheral rim with [name] large conglomeration of tissue in the joint; this was debrided with [name] full-radius shaver and ArthroWand.  The scapholunate ligament showed [name] proximal volar tear.  The dorsal portion of the ligament and mid-proximal portion were intact; this was found to be quite patulous, however.  There was no peripheral detachment of the TFCC.  The scope was then introduced in 4.5, the lunotriquetral joint inspected and found to be intact, along with the volar ligaments.  The midcarpal joint was then inspected; there was no significant opening of the scapholunate or lunotriquetral joint.  The articular surface of the proximal capitate and hamate, distal portions of the proximal carpal row all showed good cartilage.  The scope was reintroduced into the proximal joint.  [name] complete debridement of the dorsal tissue was then performed, with the capsule found to be intact; this was then shrunk with an ArthroWand, affirming the attachment of the TFCC.  The scope was then introduced in the ulnar aspect.  The scapholunate was then shrunk.  The dorsal portion was found to be entirely intact, with the tear being approximately 25% of the volar portion of the scapholunate ligament; this firmly pulled the scapholunate together.  No patulous component was observed.  The instruments were removed, the portals closed with interrupted 5-0 nylon suture, sterile compressive dressing and long arm splint with the arm supinated, thumb spica in nature, was applied.  The patient tolerated the procedure  well and was taken to the recovery room for observation in satisfactory condition.  She is discharged home to return to the [facility] [place] in one week on Vicodin and Keflex. Dictated by:   Nicki Reaper, M.D. Attending Physician:  Ronne Binning DD:  11/11/01 TD:  11/12/01 Job: [id number]

## 2011-03-22 LAB — CBC
Hemoglobin: 11.1 g/dL — ABNORMAL LOW (ref 12.0–15.0)
MCHC: 34.8 g/dL (ref 30.0–36.0)
RDW: 12.8 % (ref 11.5–15.5)

## 2011-03-22 LAB — COMPREHENSIVE METABOLIC PANEL
ALT: 32 U/L (ref 0–35)
AST: 27 U/L (ref 0–37)
Albumin: 3.2 g/dL — ABNORMAL LOW (ref 3.5–5.2)
Alkaline Phosphatase: 28 U/L — ABNORMAL LOW (ref 39–117)
Calcium: 8.6 mg/dL (ref 8.4–10.5)
GFR calc Af Amer: >60 mL/min (ref >60)
Glucose, Bld: 84 mg/dL (ref 70–99)
Potassium: 3.3 mEq/L — ABNORMAL LOW (ref 3.5–5.1)
Sodium: 134 mEq/L — ABNORMAL LOW (ref 135–145)
Total Protein: 5.9 g/dL — ABNORMAL LOW (ref 6.0–8.3)

## 2011-03-22 LAB — AMYLASE: Amylase: 59 U/L (ref 27–131)

## 2011-06-18 DIAGNOSIS — F319 Bipolar disorder, unspecified: Secondary | ICD-10-CM

## 2011-06-18 DIAGNOSIS — F4310 Post-traumatic stress disorder, unspecified: Secondary | ICD-10-CM

## 2011-06-18 DIAGNOSIS — F419 Anxiety disorder, unspecified: Secondary | ICD-10-CM

## 2011-06-18 HISTORY — DX: Anxiety disorder, unspecified: F41.9

## 2011-06-18 HISTORY — DX: Post-traumatic stress disorder, unspecified: F43.10

## 2011-06-18 HISTORY — DX: Bipolar disorder, unspecified: F31.9

## 2011-06-18 NOTE — L&D Delivery Note (Signed)
Delivery Note Pushing started at 0728.  At 7:51 AM a viable female "[name]" was delivered via Vaginal, Spontaneous Delivery (Presentation: Right Occiput Anterior).  APGAR: 8, 9; weight TBA .  Placenta status: Intact, Spontaneous, schultz.  VB heavy just prior to delivery of placenta, but improved as Pitocin bolused and fundal massage; no further interventions necessary, but will CTO closely.  Cord: 3 vessels with the following complications: None.  Cord pH: not collected  Anesthesia: Epidural  Episiotomy: None Lacerations: hemostatic periurethral (superior to meatus) not repaired Suture Repair: n/a Est. Blood Loss (mL): 350  Mom to postpartum.  Baby to nursery-stable. Plans to BF and outpatient circumcision.  Tomekia Helton H 06/05/2012, 8:28 AM

## 2011-10-30 ENCOUNTER — Ambulatory Visit (INDEPENDENT_AMBULATORY_CARE_PROVIDER_SITE_OTHER): Payer: Medicaid Other | Admitting: Obstetrics and Gynecology

## 2011-10-30 DIAGNOSIS — Z331 Pregnant state, incidental: Secondary | ICD-10-CM

## 2011-10-30 LAB — POCT URINALYSIS DIPSTICK
Bilirubin, UA: NEGATIVE
Blood, UA: NEGATIVE
Ketones, UA: NEGATIVE
Leukocytes, UA: NEGATIVE
Spec Grav, UA: 1.015
pH, UA: 6

## 2011-10-30 NOTE — Progress Notes (Signed)
PT STATES WAS RECENTLY DX'D W/ BIPOLAR DISORDER, PTSD, AND ANXIETY 08/2011, WAS TAKING LAMICTAL AND HAS D/C'D MEDS DUE TO CURRENT PREGNANCY, PT STATES IS FEELING WELL SO FAR.

## 2011-10-31 LAB — PRENATAL PANEL VII
Antibody Screen: NEGATIVE
Eosinophils Absolute: 0.1 10*3/uL (ref 0.0–0.7)
Eosinophils Relative: 1 % (ref 0–5)
HCT: 39.3 % (ref 36.0–46.0)
Lymphocytes Relative: 17 % (ref 12–46)
Lymphs Abs: 1.7 10*3/uL (ref 0.7–4.0)
MCH: 29.5 pg (ref 26.0–34.0)
MCV: 87.7 fL (ref 78.0–100.0)
Monocytes Absolute: 0.5 10*3/uL (ref 0.1–1.0)
RBC: 4.48 MIL/uL (ref 3.87–5.11)
Rh Type: POSITIVE
Rubella: 31.9 IU/mL — ABNORMAL HIGH
WBC: 10.3 10*3/uL (ref 4.0–10.5)

## 2011-11-01 ENCOUNTER — Telehealth: Payer: Self-pay

## 2011-11-01 DIAGNOSIS — O2340 Unspecified infection of urinary tract in pregnancy, unspecified trimester: Secondary | ICD-10-CM

## 2011-11-01 MED ORDER — NITROFURANTOIN MONOHYD MACRO 100 MG PO CAPS: 100.0000 mg | ORAL_CAPSULE | Freq: Two times a day (BID) | ORAL | Status: AC

## 2011-11-01 NOTE — Telephone Encounter (Signed)
Pt called back with Pharmacy information. Macrobid 100 mg bid x 7 days #14 sent to [facility] in [facility], [place] address verified with pt. Pt is to drink 8 glasses h2O daily, avoid soda, & void frequently. Pt is to f/u in 2 wks for UA C&S retest. Pt has an appt already scheduled 5/29 @ 3:30 pm will retest at that appt. Pt agrees & voices understanding.

## 2011-11-01 NOTE — Progress Notes (Signed)
Quick Note:  RX macrobid 100 mg BID x 7 days #14, 2 weeks test of cure UA C&S lab only visit, 8 water daily, frequent voids. Constance Haw, CNM ______

## 2011-11-01 NOTE — Telephone Encounter (Signed)
Spoke with pt informed her UA test abnormal. MK has prescribed Macrobid 100 mg bid x 7 days # 14 pt states she is out of town and wants rx to be called into a pharmacy in [place]. Pt unsure which pharmacy but will c/b with the pharmacy information.

## 2011-11-01 NOTE — Telephone Encounter (Signed)
Message copied by Janeece Agee on Fri Nov 01, 2011 10:25 AM ------      Message from: [facility], [place]      Created: Fri Nov 01, 2011  9:47 AM       RX macrobid 100 mg BID x 7 days #14, 2 weeks test of cure UA C&S lab only visit, 8 water daily, frequent voids. Constance Haw, CNM

## 2011-11-02 LAB — CULTURE, OB URINE

## 2011-11-13 ENCOUNTER — Ambulatory Visit (INDEPENDENT_AMBULATORY_CARE_PROVIDER_SITE_OTHER): Payer: Medicaid Other | Admitting: Obstetrics and Gynecology

## 2011-11-13 ENCOUNTER — Encounter: Payer: Self-pay | Admitting: Obstetrics and Gynecology

## 2011-11-13 VITALS — BP 112/80 | Wt 190.0 lb

## 2011-11-13 DIAGNOSIS — Z331 Pregnant state, incidental: Secondary | ICD-10-CM

## 2011-11-13 DIAGNOSIS — F3181 Bipolar II disorder: Secondary | ICD-10-CM

## 2011-11-13 DIAGNOSIS — A498 Other bacterial infections of unspecified site: Secondary | ICD-10-CM

## 2011-11-13 DIAGNOSIS — B9620 Unspecified Escherichia coli [E. coli] as the cause of diseases classified elsewhere: Secondary | ICD-10-CM

## 2011-11-13 DIAGNOSIS — F3189 Other bipolar disorder: Secondary | ICD-10-CM

## 2011-11-13 DIAGNOSIS — G43909 Migraine, unspecified, not intractable, without status migrainosus: Secondary | ICD-10-CM

## 2011-11-13 DIAGNOSIS — N390 Urinary tract infection, site not specified: Secondary | ICD-10-CM

## 2011-11-13 DIAGNOSIS — Z124 Encounter for screening for malignant neoplasm of cervix: Secondary | ICD-10-CM

## 2011-11-13 DIAGNOSIS — F4310 Post-traumatic stress disorder, unspecified: Secondary | ICD-10-CM

## 2011-11-13 DIAGNOSIS — B009 Herpesviral infection, unspecified: Secondary | ICD-10-CM

## 2011-11-13 LAB — POCT WET PREP (WET MOUNT)
Clue Cells Wet Prep Whiff POC: POSITIVE
Trichomonas Wet Prep HPF POC: NEGATIVE
pH: 5

## 2011-11-13 LAB — POCT URINALYSIS DIPSTICK
Blood, UA: NEGATIVE
Glucose, UA: NEGATIVE
Nitrite, UA: NEGATIVE
Urobilinogen, UA: NEGATIVE

## 2011-11-13 NOTE — Progress Notes (Signed)
Agrees to genetic screening  ?'s Lamictal during pregnancy  Pap done 09/2010 WNL Pt finished Macrobid 3 days ago

## 2011-11-13 NOTE — Assessment & Plan Note (Signed)
Treated in 1st trimester--recheck TOC at [facility]

## 2011-11-13 NOTE — Progress Notes (Signed)
Subjective:    [name] is being seen today for her first obstetrical visit.  She is at [redacted]w[redacted]d gestation by LMP.   She denies significant issues.  She was diagnosed with bipolar 2 and PTSD before pregnancy, and was on Lamictil.  She stopped this with pregnancy, but has concerns regarding risk of clefts.  She recently completed a 3 day Macrobid course for + Ecoli culture on urine.  Her obstetrical history is significant for: Hx HSV 2 Hx 1st trimester exposure to Lamictil Recent Ecoli asymptomatic bacteruria--check TOC at [facility]  Relationship with FOB: significant other, living together. New partner Patient does intend to breast feed.  Pregnancy history fully reviewed.   The following portions of the patient's history were reviewed and updated as appropriate: allergies, current medications, past family history, past medical history, past social history, past surgical history and problem list.  Review of Systems Pertinent ROS is described in HPI   Objective:   BP 112/80  Wt 190 lb (86.183 kg)  LMP 08/28/2011 Wt Readings from Last 1 Encounters:  11/13/11 190 lb (86.183 kg)   BMI: There is no height on file to calculate BMI.  General: alert, cooperative and no distress Respiratory: clear to auscultation bilaterally Cardiovascular: regular rate and rhythm, S1, S2 normal, no murmur Gastrointestinal: soft, non-tender; no masses,  no organomegaly Extremities: extremities normal, no pain or edema Vaginal Bleeding: none  EXTERNAL GENITALIA: normal appearing vulva with no masses, tenderness or lesions VAGINA: no abnormal discharge or lesions CERVIX: no lesions or cervical motion tenderness UTERUS: gravid and consistent with 11 weeks ADNEXA: no masses palpable and nontender OB EXAM PELVIMETRY: appears adequate   FHR:  160  bpm  Assessment:    Pregnancy at 11 and 0/7 weeks  Pregnancy risk status:  complicated by Lamictal exposure 1st trimester, Hx HSV 2   Plan:       Prenatal panel reviewed and discussed with the patient today. Pap smear collected:yes GC/Chlamydia collected:yes Discussion of Genetic testing options: Prenatal vitamins recommended Problem list reviewed and updated.  Plan of care: Follow up in 1-2 weeks for 1st trimester screen, then ROB in 4 weeks. Re-check TOC urine culture at [facility].  Nigel Bridgeman, CNM 11/13/2011 7:01 PM

## 2011-11-15 LAB — PAP IG, CT-NG, RFX HPV ASCU
Chlamydia Probe Amp: NEGATIVE
GC Probe Amp: NEGATIVE

## 2011-11-21 ENCOUNTER — Other Ambulatory Visit: Payer: Self-pay | Admitting: Obstetrics and Gynecology

## 2011-11-21 ENCOUNTER — Ambulatory Visit (INDEPENDENT_AMBULATORY_CARE_PROVIDER_SITE_OTHER): Payer: Medicaid Other

## 2011-11-21 DIAGNOSIS — Z36 Encounter for antenatal screening of mother: Secondary | ICD-10-CM

## 2011-11-21 DIAGNOSIS — Z331 Pregnant state, incidental: Secondary | ICD-10-CM

## 2011-11-22 LAB — US OB COMP LESS 14 WKS

## 2011-12-10 ENCOUNTER — Encounter: Payer: Self-pay | Admitting: Obstetrics and Gynecology

## 2011-12-10 ENCOUNTER — Ambulatory Visit (INDEPENDENT_AMBULATORY_CARE_PROVIDER_SITE_OTHER): Payer: Medicaid Other | Admitting: Obstetrics and Gynecology

## 2011-12-10 VITALS — BP 116/66 | Wt 194.0 lb

## 2011-12-10 DIAGNOSIS — Z331 Pregnant state, incidental: Secondary | ICD-10-CM

## 2011-12-10 DIAGNOSIS — Z3490 Encounter for supervision of normal pregnancy, unspecified, unspecified trimester: Secondary | ICD-10-CM

## 2011-12-10 DIAGNOSIS — R300 Dysuria: Secondary | ICD-10-CM

## 2011-12-10 LAB — POCT URINALYSIS DIPSTICK
Bilirubin, UA: NEGATIVE
Glucose, UA: NEGATIVE
Leukocytes, UA: NEGATIVE
Nitrite, UA: NEGATIVE
Urobilinogen, UA: NEGATIVE
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
## 2011-12-10 NOTE — Progress Notes (Signed)
Pt c/o burning while voiding.        

## 2012-01-07 ENCOUNTER — Ambulatory Visit (INDEPENDENT_AMBULATORY_CARE_PROVIDER_SITE_OTHER): Payer: Medicaid Other

## 2012-01-07 ENCOUNTER — Ambulatory Visit (INDEPENDENT_AMBULATORY_CARE_PROVIDER_SITE_OTHER): Payer: Medicaid Other | Admitting: Obstetrics and Gynecology

## 2012-01-07 ENCOUNTER — Encounter: Payer: Self-pay | Admitting: Obstetrics and Gynecology

## 2012-01-07 VITALS — BP 110/68 | Wt 204.0 lb

## 2012-01-07 DIAGNOSIS — Z3490 Encounter for supervision of normal pregnancy, unspecified, unspecified trimester: Secondary | ICD-10-CM

## 2012-01-07 DIAGNOSIS — Z331 Pregnant state, incidental: Secondary | ICD-10-CM

## 2012-01-07 DIAGNOSIS — Z3689 Encounter for other specified antenatal screening: Secondary | ICD-10-CM

## 2012-01-07 NOTE — Progress Notes (Signed)
Pt . Stated she sometimes feels very faint? Had good hgb at [facility].  Rec increase fluids  Pt also stated she thinks she might have acid reflux ?  Has heartburn but has not tried Scientist, research (medical).  Declines script AFP today.  Ultrasound:U:  SIUP  S=D     [place] EDD: 06/03/12           AFI:nl           Cervical length: 4.10 cm           Placenta localization: posterior           Fetal presentation: cephalic                   Anatomy survey is normal           Gender : female

## 2012-01-08 LAB — ALPHA FETOPROTEIN, MATERNAL
AFP: 32.4 IU/mL
Curr Gest Age: 18.6 wks.days
MoM for AFP: 0.9

## 2012-01-09 LAB — US OB COMP + 14 WK

## 2012-02-04 ENCOUNTER — Ambulatory Visit (INDEPENDENT_AMBULATORY_CARE_PROVIDER_SITE_OTHER): Payer: Medicaid Other | Admitting: Obstetrics and Gynecology

## 2012-02-04 ENCOUNTER — Encounter: Payer: Self-pay | Admitting: Obstetrics and Gynecology

## 2012-02-04 VITALS — BP 120/80 | Wt 208.0 lb

## 2012-02-04 DIAGNOSIS — Z3480 Encounter for supervision of other normal pregnancy, unspecified trimester: Secondary | ICD-10-CM

## 2012-02-04 NOTE — Progress Notes (Signed)
Pt without c/o AFP NEG GLUCOLA @[facility]  PT SEEING BONNIE KENNEDY FOR COUNSELING AND DOING WELL

## 2012-02-04 NOTE — Progress Notes (Signed)
Pt c/o her hands swelling in the morning.

## 2012-03-03 ENCOUNTER — Ambulatory Visit (INDEPENDENT_AMBULATORY_CARE_PROVIDER_SITE_OTHER): Payer: Medicaid Other | Admitting: Obstetrics and Gynecology

## 2012-03-03 ENCOUNTER — Encounter: Payer: Self-pay | Admitting: Obstetrics and Gynecology

## 2012-03-03 ENCOUNTER — Other Ambulatory Visit: Payer: Medicaid Other

## 2012-03-03 VITALS — BP 122/62 | Wt 215.0 lb

## 2012-03-03 DIAGNOSIS — M2540 Effusion, unspecified joint: Secondary | ICD-10-CM

## 2012-03-03 DIAGNOSIS — Z331 Pregnant state, incidental: Secondary | ICD-10-CM

## 2012-03-03 NOTE — Progress Notes (Signed)
[redacted]w[redacted]d Glucola given

## 2012-03-04 LAB — GLUCOSE TOLERANCE, 1 HOUR (50G) W/O FASTING: Glucose, 1 Hour GTT: 101 mg/dL (ref 70–140)

## 2012-03-04 LAB — RPR

## 2012-03-04 NOTE — Progress Notes (Signed)
Doing well. Glucola, Hgb, RPR today. A+ type. Mood stable, seeing counselor with benefit. Some joint swelling in knees and wrists--check ANA titer.

## 2012-04-01 ENCOUNTER — Ambulatory Visit (INDEPENDENT_AMBULATORY_CARE_PROVIDER_SITE_OTHER): Payer: Medicaid Other | Admitting: Obstetrics and Gynecology

## 2012-04-01 VITALS — BP 112/70 | Wt 220.0 lb

## 2012-04-01 DIAGNOSIS — Z331 Pregnant state, incidental: Secondary | ICD-10-CM

## 2012-04-01 DIAGNOSIS — Z23 Encounter for immunization: Secondary | ICD-10-CM

## 2012-04-01 DIAGNOSIS — Z3490 Encounter for supervision of normal pregnancy, unspecified, unspecified trimester: Secondary | ICD-10-CM

## 2012-04-01 NOTE — Progress Notes (Signed)
Pt desires flu vaccine

## 2012-04-01 NOTE — Addendum Note (Signed)
Addended by: Stephens Shire on: 04/01/2012 04:26 PM   Modules accepted: Orders

## 2012-04-01 NOTE — Progress Notes (Signed)
ANA negative Glucola and Hgb WNL. Doing well, some carpal tunnel sx. Watch weight--no swelling noted. Start Valtrex suppressive Rx at 34 weeks.

## 2012-04-22 ENCOUNTER — Ambulatory Visit (INDEPENDENT_AMBULATORY_CARE_PROVIDER_SITE_OTHER): Payer: Medicaid Other | Admitting: Obstetrics and Gynecology

## 2012-04-22 VITALS — BP 118/62 | Wt 224.0 lb

## 2012-04-22 DIAGNOSIS — B009 Herpesviral infection, unspecified: Secondary | ICD-10-CM

## 2012-04-22 MED ORDER — VALACYCLOVIR HCL 500 MG PO TABS: 500.0000 mg | ORAL_TABLET | Freq: Every day | ORAL | Status: DC

## 2012-04-22 NOTE — Progress Notes (Signed)
URI this week, but improving. Rx Valtrex for suppressive tx sent to pharmacy [facility], cultures [facility]

## 2012-04-22 NOTE — Progress Notes (Signed)
Pt stated no issues today.  

## 2012-04-25 ENCOUNTER — Encounter (HOSPITAL_COMMUNITY): Payer: Self-pay | Admitting: *Deleted

## 2012-04-25 ENCOUNTER — Inpatient Hospital Stay (HOSPITAL_COMMUNITY)
Admission: AD | Admit: 2012-04-25 | Discharge: 2012-04-25 | Disposition: A | Discharge status: Home / Self Care | Payer: Medicaid Other | Source: Ambulatory Visit | Attending: Obstetrics and Gynecology | Admitting: Obstetrics and Gynecology

## 2012-04-25 DIAGNOSIS — R059 Cough, unspecified: Secondary | ICD-10-CM | POA: Insufficient documentation

## 2012-04-25 DIAGNOSIS — Z331 Pregnant state, incidental: Secondary | ICD-10-CM

## 2012-04-25 DIAGNOSIS — O99891 Other specified diseases and conditions complicating pregnancy: Secondary | ICD-10-CM | POA: Insufficient documentation

## 2012-04-25 DIAGNOSIS — J069 Acute upper respiratory infection, unspecified: Secondary | ICD-10-CM | POA: Insufficient documentation

## 2012-04-25 DIAGNOSIS — R05 Cough: Secondary | ICD-10-CM | POA: Insufficient documentation

## 2012-04-25 DIAGNOSIS — J029 Acute pharyngitis, unspecified: Secondary | ICD-10-CM | POA: Insufficient documentation

## 2012-04-25 MED ORDER — CHLORPHENIRAMINE-HYDROCODONE 8-10 MG/5ML PO LQCR: 5.0000 mL | Freq: Every evening | ORAL | Status: DC | PRN

## 2012-04-25 MED ORDER — GUAIFENESIN ER 600 MG PO TB12: 1200.0000 mg | ORAL_TABLET | Freq: Two times a day (BID) | ORAL | Status: DC
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
## 2012-04-25 NOTE — MAU Note (Signed)
Patient states as of Monday she thought she caught a virus from her daughter. Its been almost a week and still coughing. She has a sore throat and  both ears are clogged. No fever or chills.

## 2012-04-27 ENCOUNTER — Encounter (HOSPITAL_COMMUNITY): Payer: Self-pay | Admitting: *Deleted

## 2012-04-27 ENCOUNTER — Inpatient Hospital Stay (HOSPITAL_COMMUNITY)
Admission: AD | Admit: 2012-04-27 | Discharge: 2012-04-27 | Disposition: A | Discharge status: Home / Self Care | Payer: Medicaid Other | Source: Ambulatory Visit | Attending: Obstetrics and Gynecology | Admitting: Obstetrics and Gynecology

## 2012-04-27 DIAGNOSIS — Z331 Pregnant state, incidental: Secondary | ICD-10-CM

## 2012-04-27 DIAGNOSIS — G43909 Migraine, unspecified, not intractable, without status migrainosus: Secondary | ICD-10-CM

## 2012-04-27 DIAGNOSIS — F4310 Post-traumatic stress disorder, unspecified: Secondary | ICD-10-CM

## 2012-04-27 DIAGNOSIS — B9620 Unspecified Escherichia coli [E. coli] as the cause of diseases classified elsewhere: Secondary | ICD-10-CM

## 2012-04-27 DIAGNOSIS — N390 Urinary tract infection, site not specified: Secondary | ICD-10-CM

## 2012-04-27 DIAGNOSIS — J069 Acute upper respiratory infection, unspecified: Secondary | ICD-10-CM | POA: Diagnosis present

## 2012-04-27 DIAGNOSIS — B009 Herpesviral infection, unspecified: Secondary | ICD-10-CM

## 2012-04-27 DIAGNOSIS — O99891 Other specified diseases and conditions complicating pregnancy: Secondary | ICD-10-CM | POA: Insufficient documentation

## 2012-04-27 DIAGNOSIS — J029 Acute pharyngitis, unspecified: Secondary | ICD-10-CM | POA: Insufficient documentation

## 2012-04-27 MED ORDER — AMOXICILLIN-POT CLAVULANATE 875-125 MG PO TABS
1.0000 | ORAL_TABLET | Freq: Once | ORAL | Status: AC
  Administered 2012-04-27: 1 via ORAL
  Filled 2012-04-27: qty 1

## 2012-04-27 MED ORDER — FLUCONAZOLE 150 MG PO TABS: 150.0000 mg | ORAL_TABLET | Freq: Once | ORAL | Status: DC

## 2012-04-27 MED ORDER — AMOXICILLIN-POT CLAVULANATE 875-125 MG PO TABS: 1.0000 | ORAL_TABLET | Freq: Two times a day (BID) | ORAL | Status: AC

## 2012-04-27 NOTE — MAU Note (Signed)
Pt reports she was seen here 4 days ago and had resp viral symptoms, states now she is having a sore throat, pain with swallowing, and "white bumps" on her tongue and throat.
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
## 2012-05-06 ENCOUNTER — Encounter: Payer: Self-pay | Admitting: Obstetrics and Gynecology

## 2012-05-06 ENCOUNTER — Ambulatory Visit (INDEPENDENT_AMBULATORY_CARE_PROVIDER_SITE_OTHER): Payer: Medicaid Other | Admitting: Obstetrics and Gynecology

## 2012-05-06 VITALS — BP 130/70 | Wt 227.0 lb

## 2012-05-06 DIAGNOSIS — Z331 Pregnant state, incidental: Secondary | ICD-10-CM

## 2012-05-06 LAB — OB RESULTS CONSOLE GC/CHLAMYDIA
Chlamydia: NEGATIVE
Gonorrhea: NEGATIVE

## 2012-05-06 NOTE — Patient Instructions (Signed)
Fetal Movement Counts Patient Name: __________________________________________________ Patient Due Date: ____________________ Kick counts is highly recommended in high risk pregnancies, but it is a good idea for every pregnant woman to do. Start counting fetal movements at 28 weeks of the pregnancy. Fetal movements increase after eating a full meal or eating or drinking something sweet (the blood sugar is higher). It is also important to drink plenty of fluids (well hydrated) before doing the count. Lie on your left side because it helps with the circulation or you can sit in a comfortable chair with your arms over your belly (abdomen) with no distractions around you. DOING THE COUNT  Try to do the count the same time of day each time you do it.  Mark the day and time, then see how long it takes for you to feel 10 movements (kicks, flutters, swishes, rolls). You should have at least 10 movements within 2 hours. You will most likely feel 10 movements in much less than 2 hours. If you do not, wait an hour and count again. After a couple of days you will see a pattern.  What you are looking for is a change in the pattern or not enough counts in 2 hours. Is it taking longer in time to reach 10 movements? SEEK MEDICAL CARE IF:  You feel less than 10 counts in 2 hours. Tried twice.  No movement in one hour.  The pattern is changing or taking longer each day to reach 10 counts in 2 hours.  You feel the baby is not moving as it usually does. Date: ____________ Movements: ____________ Start time: ____________ Finish time: ____________  Date: ____________ Movements: ____________ Start time: ____________ Finish time: ____________ Date: ____________ Movements: ____________ Start time: ____________ Finish time: ____________ Date: ____________ Movements: ____________ Start time: ____________ Finish time: ____________ Date: ____________ Movements: ____________ Start time: ____________ Finish time:  ____________ Date: ____________ Movements: ____________ Start time: ____________ Finish time: ____________ Date: ____________ Movements: ____________ Start time: ____________ Finish time: ____________ Date: ____________ Movements: ____________ Start time: ____________ Finish time: ____________  Date: ____________ Movements: ____________ Start time: ____________ Finish time: ____________ Date: ____________ Movements: ____________ Start time: ____________ Finish time: ____________ Date: ____________ Movements: ____________ Start time: ____________ Finish time: ____________ Date: ____________ Movements: ____________ Start time: ____________ Finish time: ____________ Date: ____________ Movements: ____________ Start time: ____________ Finish time: ____________ Date: ____________ Movements: ____________ Start time: ____________ Finish time: ____________ Date: ____________ Movements: ____________ Start time: ____________ Finish time: ____________  Date: ____________ Movements: ____________ Start time: ____________ Finish time: ____________ Date: ____________ Movements: ____________ Start time: ____________ Finish time: ____________ Date: ____________ Movements: ____________ Start time: ____________ Finish time: ____________ Date: ____________ Movements: ____________ Start time: ____________ Finish time: ____________ Date: ____________ Movements: ____________ Start time: ____________ Finish time: ____________ Date: ____________ Movements: ____________ Start time: ____________ Finish time: ____________ Date: ____________ Movements: ____________ Start time: ____________ Finish time: ____________  Date: ____________ Movements: ____________ Start time: ____________ Finish time: ____________ Date: ____________ Movements: ____________ Start time: ____________ Finish time: ____________ Date: ____________ Movements: ____________ Start time: ____________ Finish time: ____________ Date: ____________ Movements:  ____________ Start time: ____________ Finish time: ____________ Date: ____________ Movements: ____________ Start time: ____________ Finish time: ____________ Date: ____________ Movements: ____________ Start time: ____________ Finish time: ____________ Date: ____________ Movements: ____________ Start time: ____________ Finish time: ____________  Date: ____________ Movements: ____________ Start time: ____________ Finish time: ____________ Date: ____________ Movements: ____________ Start time: ____________ Finish time: ____________ Date: ____________ Movements: ____________ Start time: ____________ Finish time: ____________ Date: ____________ Movements:   ____________ Start time: ____________ Finish time: ____________ Date: ____________ Movements: ____________ Start time: ____________ Finish time: ____________ Date: ____________ Movements: ____________ Start time: ____________ Finish time: ____________ Date: ____________ Movements: ____________ Start time: ____________ Finish time: ____________  Date: ____________ Movements: ____________ Start time: ____________ Finish time: ____________ Date: ____________ Movements: ____________ Start time: ____________ Finish time: ____________ Date: ____________ Movements: ____________ Start time: ____________ Finish time: ____________ Date: ____________ Movements: ____________ Start time: ____________ Finish time: ____________ Date: ____________ Movements: ____________ Start time: ____________ Finish time: ____________ Date: ____________ Movements: ____________ Start time: ____________ Finish time: ____________ Date: ____________ Movements: ____________ Start time: ____________ Finish time: ____________  Date: ____________ Movements: ____________ Start time: ____________ Finish time: ____________ Date: ____________ Movements: ____________ Start time: ____________ Finish time: ____________ Date: ____________ Movements: ____________ Start time: ____________ Finish  time: ____________ Date: ____________ Movements: ____________ Start time: ____________ Finish time: ____________ Date: ____________ Movements: ____________ Start time: ____________ Finish time: ____________ Date: ____________ Movements: ____________ Start time: ____________ Finish time: ____________ Date: ____________ Movements: ____________ Start time: ____________ Finish time: ____________  Date: ____________ Movements: ____________ Start time: ____________ Finish time: ____________ Date: ____________ Movements: ____________ Start time: ____________ Finish time: ____________ Date: ____________ Movements: ____________ Start time: ____________ Finish time: ____________ Date: ____________ Movements: ____________ Start time: ____________ Finish time: ____________ Date: ____________ Movements: ____________ Start time: ____________ Finish time: ____________ Date: ____________ Movements: ____________ Start time: ____________ Finish time: ____________ Document Released: 07/03/2006 Document Revised: 08/26/2011 Document Reviewed: 01/03/2009 [employer] Patient Information 2013 [employer]  

## 2012-05-06 NOTE — Progress Notes (Signed)
A/P GBS with cxs done today.  No HSV lesions seen.  Outbreak started 11/16 Fetal kick counts reviewed Labor reviewed with pt All patients  questions answered Pt told if she delivers within 2 weeks of outbreak she will need c/s

## 2012-05-06 NOTE — Progress Notes (Signed)
Pt recently had herpes outbreak, has questions about delivering vaginally. GBS today.

## 2012-05-07 LAB — GC/CHLAMYDIA PROBE AMP: GC Probe RNA: NEGATIVE

## 2012-05-13 ENCOUNTER — Ambulatory Visit (INDEPENDENT_AMBULATORY_CARE_PROVIDER_SITE_OTHER): Payer: Medicaid Other | Admitting: Obstetrics and Gynecology

## 2012-05-13 ENCOUNTER — Encounter: Payer: Self-pay | Admitting: Obstetrics and Gynecology

## 2012-05-13 VITALS — BP 118/70 | Wt 230.0 lb

## 2012-05-13 DIAGNOSIS — B009 Herpesviral infection, unspecified: Secondary | ICD-10-CM

## 2012-05-13 NOTE — Progress Notes (Signed)
103w0d Pt w/o complaint today, requests cervix check. reviewed positive GBS No HSV lesions.  DAY#11 from last outbreak

## 2012-05-18 ENCOUNTER — Ambulatory Visit (INDEPENDENT_AMBULATORY_CARE_PROVIDER_SITE_OTHER): Payer: Medicaid Other | Admitting: Obstetrics and Gynecology

## 2012-05-18 ENCOUNTER — Encounter: Payer: Self-pay | Admitting: Obstetrics and Gynecology

## 2012-05-18 VITALS — BP 116/72 | Wt 228.0 lb

## 2012-05-18 DIAGNOSIS — Z331 Pregnant state, incidental: Secondary | ICD-10-CM

## 2012-05-18 DIAGNOSIS — Z3490 Encounter for supervision of normal pregnancy, unspecified, unspecified trimester: Secondary | ICD-10-CM

## 2012-05-18 MED ORDER — VALACYCLOVIR HCL 500 MG PO TABS: 500.0000 mg | ORAL_TABLET | Freq: Every day | ORAL | Status: DC

## 2012-05-18 NOTE — Progress Notes (Signed)
[redacted]w[redacted]d Refill on valtrex given FKCs and Labor Precautions No complaints

## 2012-05-25 ENCOUNTER — Ambulatory Visit (INDEPENDENT_AMBULATORY_CARE_PROVIDER_SITE_OTHER): Payer: Medicaid Other | Admitting: Obstetrics and Gynecology

## 2012-05-25 ENCOUNTER — Encounter: Payer: Self-pay | Admitting: Obstetrics and Gynecology

## 2012-05-25 VITALS — BP 126/72 | Wt 229.0 lb

## 2012-05-25 DIAGNOSIS — F3181 Bipolar II disorder: Secondary | ICD-10-CM

## 2012-05-25 DIAGNOSIS — Z331 Pregnant state, incidental: Secondary | ICD-10-CM

## 2012-05-25 DIAGNOSIS — F4310 Post-traumatic stress disorder, unspecified: Secondary | ICD-10-CM

## 2012-05-25 DIAGNOSIS — F3189 Other bipolar disorder: Secondary | ICD-10-CM

## 2012-05-25 NOTE — Progress Notes (Signed)
[redacted]w[redacted]d No complaints

## 2012-05-31 ENCOUNTER — Encounter (HOSPITAL_COMMUNITY): Payer: Self-pay

## 2012-05-31 ENCOUNTER — Inpatient Hospital Stay (HOSPITAL_COMMUNITY)
Admission: AD | Admit: 2012-05-31 | Discharge: 2012-05-31 | Disposition: A | Discharge status: Home / Self Care | Payer: Medicaid Other | Source: Ambulatory Visit | Attending: Obstetrics and Gynecology | Admitting: Obstetrics and Gynecology

## 2012-05-31 ENCOUNTER — Telehealth: Payer: Self-pay | Admitting: Obstetrics and Gynecology

## 2012-05-31 DIAGNOSIS — O2390 Unspecified genitourinary tract infection in pregnancy, unspecified trimester: Secondary | ICD-10-CM | POA: Insufficient documentation

## 2012-05-31 DIAGNOSIS — O218 Other vomiting complicating pregnancy: Secondary | ICD-10-CM

## 2012-05-31 DIAGNOSIS — R51 Headache: Secondary | ICD-10-CM

## 2012-05-31 DIAGNOSIS — O139 Gestational [pregnancy-induced] hypertension without significant proteinuria, unspecified trimester: Secondary | ICD-10-CM

## 2012-05-31 DIAGNOSIS — N760 Acute vaginitis: Secondary | ICD-10-CM

## 2012-05-31 DIAGNOSIS — A499 Bacterial infection, unspecified: Secondary | ICD-10-CM | POA: Insufficient documentation

## 2012-05-31 DIAGNOSIS — R030 Elevated blood-pressure reading, without diagnosis of hypertension: Secondary | ICD-10-CM | POA: Insufficient documentation

## 2012-05-31 DIAGNOSIS — B9689 Other specified bacterial agents as the cause of diseases classified elsewhere: Secondary | ICD-10-CM | POA: Insufficient documentation

## 2012-05-31 DIAGNOSIS — O99891 Other specified diseases and conditions complicating pregnancy: Secondary | ICD-10-CM | POA: Insufficient documentation

## 2012-05-31 LAB — URINALYSIS, ROUTINE W REFLEX MICROSCOPIC
Bilirubin Urine: NEGATIVE
Nitrite: NEGATIVE
Specific Gravity, Urine: 1.025 (ref 1.005–1.030)
pH: 6 (ref 5.0–8.0)

## 2012-05-31 LAB — COMPREHENSIVE METABOLIC PANEL
ALT: 9 U/L (ref 0–35)
AST: 13 U/L (ref 0–37)
Albumin: 2.7 g/dL — ABNORMAL LOW (ref 3.5–5.2)
Alkaline Phosphatase: 113 U/L (ref 39–117)
BUN: 5 mg/dL — ABNORMAL LOW (ref 6–23)
Chloride: 98 mEq/L (ref 96–112)
Potassium: 3.6 mEq/L (ref 3.5–5.1)
Total Bilirubin: 0.3 mg/dL (ref 0.3–1.2)

## 2012-05-31 LAB — CBC WITH DIFFERENTIAL/PLATELET
Basophils Absolute: 0 10*3/uL (ref 0.0–0.1)
Basophils Relative: 0 % (ref 0–1)
Eosinophils Absolute: 0 10*3/uL (ref 0.0–0.7)
Eosinophils Relative: 0 % (ref 0–5)
HCT: 34 % — ABNORMAL LOW (ref 36.0–46.0)
MCH: 29.8 pg (ref 26.0–34.0)
MCHC: 33.5 g/dL (ref 30.0–36.0)
MCV: 89 fL (ref 78.0–100.0)
Monocytes Absolute: 0.6 10*3/uL (ref 0.1–1.0)
RDW: 13.3 % (ref 11.5–15.5)

## 2012-05-31 LAB — AMNISURE RUPTURE OF MEMBRANE (ROM) NOT AT ARMC: Amnisure ROM: NEGATIVE

## 2012-05-31 LAB — URIC ACID: Uric Acid, Serum: 4.1 mg/dL (ref 2.4–7.0)

## 2012-05-31 MED ORDER — METRONIDAZOLE 500 MG PO TABS: 500.0000 mg | ORAL_TABLET | Freq: Two times a day (BID) | ORAL | Status: DC

## 2012-05-31 MED ORDER — FLUCONAZOLE 150 MG PO TABS: 150.0000 mg | ORAL_TABLET | Freq: Once | ORAL | Status: DC

## 2012-05-31 NOTE — Telephone Encounter (Signed)
TC from patient--took BP at home due to feeling nauseated (father has BP monitor at home).  BP 136/96. Denies HA, visual symptoms, epigastric pain, no hx of elevated BP during this or previous pregnancy.  Recommended patient get off her feet, push fluids, repeat BP after at least 30 min of rest. To call with update at that time.  Advised patient may need to be seen in MAU for BP w/u. Patient hopes to avoid MAU visit.
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
## 2012-05-31 NOTE — MAU Note (Signed)
Pt states that she has not been feeling well for several days but today was the first time she checked her blood pressure and states it was 133/95. Pt c/o nausea and a headache as well.

## 2012-06-03 ENCOUNTER — Ambulatory Visit (INDEPENDENT_AMBULATORY_CARE_PROVIDER_SITE_OTHER): Payer: Medicaid Other | Admitting: Obstetrics and Gynecology

## 2012-06-03 ENCOUNTER — Encounter: Payer: Self-pay | Admitting: Obstetrics and Gynecology

## 2012-06-03 ENCOUNTER — Telehealth: Payer: Self-pay | Admitting: Obstetrics and Gynecology

## 2012-06-03 VITALS — BP 122/80 | Wt 230.0 lb

## 2012-06-03 DIAGNOSIS — Z331 Pregnant state, incidental: Secondary | ICD-10-CM

## 2012-06-03 NOTE — Telephone Encounter (Signed)
Induction scheduled for 06/04/13 @ 7:30pm with AVS/CHS. -Adrianne Pridgen

## 2012-06-03 NOTE — Progress Notes (Signed)
[redacted]w[redacted]d GFM Reviewed IOL R&B. Pt desires to proceed. Wills schedule

## 2012-06-03 NOTE — Progress Notes (Signed)
[redacted]w[redacted]d  pt has no complaints Pt would like cervix checked  Pt went to [facility] on 06/01/2012 and Dx with BV and was given Flagyl

## 2012-06-04 ENCOUNTER — Telehealth (HOSPITAL_COMMUNITY): Payer: Self-pay | Admitting: *Deleted

## 2012-06-04 ENCOUNTER — Inpatient Hospital Stay (HOSPITAL_COMMUNITY)
Admission: RE | Admit: 2012-06-04 | Discharge: 2012-06-07 | DRG: 775 | Disposition: A | Discharge status: Home / Self Care | Payer: Medicaid Other | Source: Ambulatory Visit | Attending: Obstetrics and Gynecology | Admitting: Obstetrics and Gynecology

## 2012-06-04 ENCOUNTER — Encounter (HOSPITAL_COMMUNITY): Payer: Self-pay

## 2012-06-04 ENCOUNTER — Encounter (HOSPITAL_COMMUNITY): Payer: Self-pay | Admitting: *Deleted

## 2012-06-04 VITALS — BP 116/81 | HR 80 | Temp 98.0°F | Resp 18 | Ht 69.0 in | Wt 230.0 lb

## 2012-06-04 DIAGNOSIS — J069 Acute upper respiratory infection, unspecified: Secondary | ICD-10-CM

## 2012-06-04 DIAGNOSIS — Z22330 Carrier of Group B streptococcus: Secondary | ICD-10-CM

## 2012-06-04 DIAGNOSIS — B009 Herpesviral infection, unspecified: Secondary | ICD-10-CM | POA: Diagnosis present

## 2012-06-04 DIAGNOSIS — F3181 Bipolar II disorder: Secondary | ICD-10-CM | POA: Diagnosis present

## 2012-06-04 DIAGNOSIS — F4310 Post-traumatic stress disorder, unspecified: Secondary | ICD-10-CM | POA: Diagnosis present

## 2012-06-04 DIAGNOSIS — O99820 Streptococcus B carrier state complicating pregnancy: Secondary | ICD-10-CM

## 2012-06-04 DIAGNOSIS — B9620 Unspecified Escherichia coli [E. coli] as the cause of diseases classified elsewhere: Secondary | ICD-10-CM

## 2012-06-04 DIAGNOSIS — G43909 Migraine, unspecified, not intractable, without status migrainosus: Secondary | ICD-10-CM

## 2012-06-04 DIAGNOSIS — O99892 Other specified diseases and conditions complicating childbirth: Principal | ICD-10-CM | POA: Diagnosis present

## 2012-06-04 LAB — COMPREHENSIVE METABOLIC PANEL
ALT: 9 U/L (ref 0–35)
AST: 11 U/L (ref 0–37)
Albumin: 2.5 g/dL — ABNORMAL LOW (ref 3.5–5.2)
CO2: 23 mEq/L (ref 19–32)
Chloride: 101 mEq/L (ref 96–112)
GFR calc non Af Amer: >90 mL/min (ref >90)
Sodium: 134 mEq/L — ABNORMAL LOW (ref 135–145)
Total Bilirubin: 0.2 mg/dL — ABNORMAL LOW (ref 0.3–1.2)

## 2012-06-04 LAB — CBC
HCT: 32.8 % — ABNORMAL LOW (ref 36.0–46.0)
Hemoglobin: 11 g/dL — ABNORMAL LOW (ref 12.0–15.0)
MCV: 88.9 fL (ref 78.0–100.0)
RBC: 3.69 MIL/uL — ABNORMAL LOW (ref 3.87–5.11)
WBC: 8.7 10*3/uL (ref 4.0–10.5)

## 2012-06-04 LAB — URINALYSIS, ROUTINE W REFLEX MICROSCOPIC
Bilirubin Urine: NEGATIVE
Glucose, UA: NEGATIVE mg/dL
Protein, ur: NEGATIVE mg/dL
Specific Gravity, Urine: 1.02 (ref 1.005–1.030)
Urobilinogen, UA: 0.2 mg/dL (ref 0.0–1.0)

## 2012-06-04 LAB — URIC ACID: Uric Acid, Serum: 4.3 mg/dL (ref 2.4–7.0)

## 2012-06-04 LAB — URINE MICROSCOPIC-ADD ON

## 2012-06-04 LAB — LACTATE DEHYDROGENASE: LDH: 135 U/L (ref 94–250)

## 2012-06-04 MED ORDER — HYDROXYZINE HCL 50 MG PO TABS: 50.0000 mg | ORAL_TABLET | Freq: Four times a day (QID) | ORAL | Status: DC | PRN

## 2012-06-04 MED ORDER — OXYTOCIN 40 UNITS IN LACTATED RINGERS INFUSION - SIMPLE MED
62.5000 mL/h | INTRAVENOUS | Status: DC
  Filled 2012-06-04: qty 1000

## 2012-06-04 MED ORDER — LIDOCAINE HCL (PF) 1 % IJ SOLN: 30.0000 mL | INTRAMUSCULAR | Status: DC | PRN

## 2012-06-04 MED ORDER — SODIUM CHLORIDE 0.9 % IV SOLN: 250.0000 mL | INTRAVENOUS | Status: DC | PRN

## 2012-06-04 MED ORDER — OXYTOCIN BOLUS FROM INFUSION
500.0000 mL | INTRAVENOUS | Status: DC
  Administered 2012-06-05: 500 mL via INTRAVENOUS

## 2012-06-04 MED ORDER — HYDROXYZINE HCL 50 MG/ML IM SOLN: 50.0000 mg | Freq: Four times a day (QID) | INTRAMUSCULAR | Status: DC | PRN

## 2012-06-04 MED ORDER — ONDANSETRON HCL 4 MG/2ML IJ SOLN: 4.0000 mg | Freq: Four times a day (QID) | INTRAMUSCULAR | Status: DC | PRN

## 2012-06-04 MED ORDER — IBUPROFEN 600 MG PO TABS
600.0000 mg | ORAL_TABLET | Freq: Four times a day (QID) | ORAL | Status: DC | PRN
  Administered 2012-06-05: 600 mg via ORAL
  Filled 2012-06-04: qty 1

## 2012-06-04 MED ORDER — TERBUTALINE SULFATE 1 MG/ML IJ SOLN: 0.2500 mg | Freq: Once | INTRAMUSCULAR | Status: AC | PRN

## 2012-06-04 MED ORDER — PENICILLIN G POTASSIUM 5000000 UNITS IJ SOLR: 5.0000 10*6.[IU] | Freq: Once | INTRAVENOUS | Status: DC

## 2012-06-04 MED ORDER — SODIUM CHLORIDE 0.9 % IJ SOLN: 3.0000 mL | INTRAMUSCULAR | Status: DC | PRN

## 2012-06-04 MED ORDER — OXYCODONE-ACETAMINOPHEN 5-325 MG PO TABS: 1.0000 | ORAL_TABLET | ORAL | Status: DC | PRN

## 2012-06-04 MED ORDER — ACETAMINOPHEN 325 MG PO TABS: 650.0000 mg | ORAL_TABLET | ORAL | Status: DC | PRN

## 2012-06-04 MED ORDER — SODIUM CHLORIDE 0.9 % IJ SOLN
3.0000 mL | Freq: Two times a day (BID) | INTRAMUSCULAR | Status: DC
Start: 2012-06-04 — End: 2012-06-04

## 2012-06-04 MED ORDER — LACTATED RINGERS IV SOLN: 500.0000 mL | INTRAVENOUS | Status: DC | PRN

## 2012-06-04 MED ORDER — OXYTOCIN 40 UNITS IN LACTATED RINGERS INFUSION - SIMPLE MED
1.0000 m[IU]/min | INTRAVENOUS | Status: DC
  Administered 2012-06-04: 2 m[IU]/min via INTRAVENOUS

## 2012-06-04 MED ORDER — PENICILLIN G POTASSIUM 5000000 UNITS IJ SOLR: 2.5000 10*6.[IU] | INTRAMUSCULAR | Status: DC

## 2012-06-04 MED ORDER — CITRIC ACID-SODIUM CITRATE 334-500 MG/5ML PO SOLN
30.0000 mL | ORAL | Status: DC | PRN
  Administered 2012-06-05 (×2): 30 mL via ORAL
  Filled 2012-06-04 (×2): qty 15

## 2012-06-04 MED ORDER — LACTATED RINGERS IV SOLN
INTRAVENOUS | Status: DC
  Administered 2012-06-05: 02:00:00 via INTRAVENOUS

## 2012-06-04 MED ORDER — DEXTROSE 5 % IV SOLN
2.5000 10*6.[IU] | INTRAVENOUS | Status: DC
  Administered 2012-06-05 (×2): 2.5 10*6.[IU] via INTRAVENOUS
  Filled 2012-06-04 (×5): qty 2.5

## 2012-06-04 MED ORDER — PENICILLIN G POTASSIUM 5000000 UNITS IJ SOLR
5.0000 10*6.[IU] | Freq: Once | INTRAVENOUS | Status: AC
  Administered 2012-06-04: 5 10*6.[IU] via INTRAVENOUS
  Filled 2012-06-04: qty 5

## 2012-06-04 NOTE — Telephone Encounter (Signed)
Preadmission screen  

## 2012-06-04 NOTE — Progress Notes (Signed)
Patient ID: [name], female   DOB: 1981-05-10, 31 y.o.   MRN: [id number] Pt is 31 y.o. and at [redacted]w[redacted]d for elective induction, G4P1   .Subjective:  Pt denies any ctx, no VB, or LOF, GFM, denies any HA/N/V/abd pain, "just ready"   Objective: BP 124/74  Pulse 85  Temp 98.4 F (36.9 C) (Oral)  Resp 18  Ht 5\' 9"  (1.753 m)  Wt 230 lb (104.327 kg)  BMI 33.96 kg/m2  LMP 08/28/2011   FHT:  FHR: 130 bpm, variability: moderate,  accelerations:  Present,  decelerations:  Absent UC:   irregular, every 5-8 minutes SVE:   Dilation: 3 Effacement (%): 50 Station: -2 Exam by:: [name], CNM   Spec exam no lesion seen   Assessment / Plan: elective induction GBS pos   Fetal Wellbeing:  Category I Pain Control:  planning epidural   Plan:  Begin pitocin Pen g for gbs prophylaxis  arom Anticipate NSVD  Update physician PRN  [name] 06/04/2012, 10:19 PM
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
## 2012-06-05 ENCOUNTER — Inpatient Hospital Stay (HOSPITAL_COMMUNITY): Payer: Medicaid Other | Admitting: Anesthesiology

## 2012-06-05 ENCOUNTER — Encounter (HOSPITAL_COMMUNITY): Payer: Self-pay

## 2012-06-05 ENCOUNTER — Encounter (HOSPITAL_COMMUNITY): Payer: Self-pay | Admitting: Anesthesiology

## 2012-06-05 MED ORDER — LACTATED RINGERS IV SOLN
500.0000 mL | Freq: Once | INTRAVENOUS | Status: AC
  Administered 2012-06-05: 500 mL via INTRAVENOUS

## 2012-06-05 MED ORDER — MEASLES, MUMPS & RUBELLA VAC ~~LOC~~ INJ
0.5000 mL | INJECTION | Freq: Once | SUBCUTANEOUS | Status: DC
  Filled 2012-06-05: qty 0.5

## 2012-06-05 MED ORDER — ONDANSETRON HCL 4 MG/2ML IJ SOLN: 4.0000 mg | INTRAMUSCULAR | Status: DC | PRN

## 2012-06-05 MED ORDER — PHENYLEPHRINE 40 MCG/ML (10ML) SYRINGE FOR IV PUSH (FOR BLOOD PRESSURE SUPPORT): 80.0000 ug | PREFILLED_SYRINGE | INTRAVENOUS | Status: DC | PRN

## 2012-06-05 MED ORDER — PHENYLEPHRINE 40 MCG/ML (10ML) SYRINGE FOR IV PUSH (FOR BLOOD PRESSURE SUPPORT)
80.0000 ug | PREFILLED_SYRINGE | INTRAVENOUS | Status: DC | PRN
  Filled 2012-06-05: qty 5

## 2012-06-05 MED ORDER — KETOROLAC TROMETHAMINE 30 MG/ML IJ SOLN
INTRAMUSCULAR | Status: AC
  Filled 2012-06-05: qty 1

## 2012-06-05 MED ORDER — DIPHENHYDRAMINE HCL 25 MG PO CAPS: 25.0000 mg | ORAL_CAPSULE | Freq: Four times a day (QID) | ORAL | Status: DC | PRN

## 2012-06-05 MED ORDER — ONDANSETRON HCL 4 MG PO TABS: 4.0000 mg | ORAL_TABLET | ORAL | Status: DC | PRN

## 2012-06-05 MED ORDER — PRENATAL MULTIVITAMIN CH
1.0000 | ORAL_TABLET | Freq: Every day | ORAL | Status: DC
  Administered 2012-06-05 – 2012-06-07 (×3): 1 via ORAL
  Filled 2012-06-05 (×3): qty 1

## 2012-06-05 MED ORDER — SENNOSIDES-DOCUSATE SODIUM 8.6-50 MG PO TABS
2.0000 | ORAL_TABLET | Freq: Every day | ORAL | Status: DC
  Administered 2012-06-05 – 2012-06-06 (×2): 2 via ORAL

## 2012-06-05 MED ORDER — FENTANYL 2.5 MCG/ML BUPIVACAINE 1/10 % EPIDURAL INFUSION (WH - ANES)
14.0000 mL/h | INTRAMUSCULAR | Status: DC
  Administered 2012-06-05: 14 mL/h via EPIDURAL
  Filled 2012-06-05: qty 125

## 2012-06-05 MED ORDER — MIDAZOLAM HCL 2 MG/2ML IJ SOLN
INTRAMUSCULAR | Status: AC
  Filled 2012-06-05: qty 2

## 2012-06-05 MED ORDER — MISOPROSTOL 200 MCG PO TABS
ORAL_TABLET | ORAL | Status: AC
  Filled 2012-06-05: qty 5

## 2012-06-05 MED ORDER — LANOLIN HYDROUS EX OINT: TOPICAL_OINTMENT | CUTANEOUS | Status: DC | PRN

## 2012-06-05 MED ORDER — ZOLPIDEM TARTRATE 5 MG PO TABS: 5.0000 mg | ORAL_TABLET | Freq: Every evening | ORAL | Status: DC | PRN

## 2012-06-05 MED ORDER — GLYCOPYRROLATE 0.2 MG/ML IJ SOLN
INTRAMUSCULAR | Status: AC
  Filled 2012-06-05: qty 2

## 2012-06-05 MED ORDER — TETANUS-DIPHTH-ACELL PERTUSSIS 5-2.5-18.5 LF-MCG/0.5 IM SUSP
0.5000 mL | Freq: Once | INTRAMUSCULAR | Status: AC
  Administered 2012-06-06: 0.5 mL via INTRAMUSCULAR
  Filled 2012-06-05: qty 0.5

## 2012-06-05 MED ORDER — DIBUCAINE 1 % RE OINT: 1.0000 "application " | TOPICAL_OINTMENT | RECTAL | Status: DC | PRN

## 2012-06-05 MED ORDER — NEOSTIGMINE METHYLSULFATE 1 MG/ML IJ SOLN
INTRAMUSCULAR | Status: AC
  Filled 2012-06-05: qty 1

## 2012-06-05 MED ORDER — SIMETHICONE 80 MG PO CHEW: 80.0000 mg | CHEWABLE_TABLET | ORAL | Status: DC | PRN

## 2012-06-05 MED ORDER — BENZOCAINE-MENTHOL 20-0.5 % EX AERO
1.0000 "application " | INHALATION_SPRAY | CUTANEOUS | Status: DC | PRN
  Administered 2012-06-05: 1 via TOPICAL
  Filled 2012-06-05: qty 56

## 2012-06-05 MED ORDER — IBUPROFEN 600 MG PO TABS
600.0000 mg | ORAL_TABLET | Freq: Four times a day (QID) | ORAL | Status: DC
  Administered 2012-06-05 – 2012-06-07 (×8): 600 mg via ORAL
  Filled 2012-06-05 (×8): qty 1

## 2012-06-05 MED ORDER — DIPHENHYDRAMINE HCL 50 MG/ML IJ SOLN: 12.5000 mg | INTRAMUSCULAR | Status: DC | PRN

## 2012-06-05 MED ORDER — DEXAMETHASONE SODIUM PHOSPHATE 10 MG/ML IJ SOLN
INTRAMUSCULAR | Status: AC
  Filled 2012-06-05: qty 1

## 2012-06-05 MED ORDER — WITCH HAZEL-GLYCERIN EX PADS: 1.0000 "application " | MEDICATED_PAD | CUTANEOUS | Status: DC | PRN

## 2012-06-05 MED ORDER — MAGNESIUM HYDROXIDE 400 MG/5ML PO SUSP: 30.0000 mL | ORAL | Status: DC | PRN

## 2012-06-05 MED ORDER — HYDROMORPHONE HCL PF 1 MG/ML IJ SOLN
INTRAMUSCULAR | Status: AC
  Filled 2012-06-05: qty 1

## 2012-06-05 MED ORDER — FENTANYL CITRATE 0.05 MG/ML IJ SOLN
INTRAMUSCULAR | Status: AC
  Filled 2012-06-05: qty 5

## 2012-06-05 MED ORDER — SODIUM BICARBONATE 8.4 % IV SOLN
INTRAVENOUS | Status: DC | PRN
  Administered 2012-06-05: 5 mL via EPIDURAL

## 2012-06-05 MED ORDER — OXYCODONE-ACETAMINOPHEN 5-325 MG PO TABS
1.0000 | ORAL_TABLET | ORAL | Status: DC | PRN
  Administered 2012-06-05 – 2012-06-06 (×3): 1 via ORAL
  Administered 2012-06-06: 2 via ORAL
  Administered 2012-06-07: 1 via ORAL
  Filled 2012-06-05: qty 1
  Filled 2012-06-05: qty 2
  Filled 2012-06-05 (×3): qty 1

## 2012-06-05 MED ORDER — EPHEDRINE 5 MG/ML INJ
10.0000 mg | INTRAVENOUS | Status: DC | PRN
  Filled 2012-06-05: qty 4

## 2012-06-05 MED ORDER — EPHEDRINE 5 MG/ML INJ: 10.0000 mg | INTRAVENOUS | Status: DC | PRN

## 2012-06-05 NOTE — Anesthesia Preprocedure Evaluation (Signed)

## 2012-06-05 NOTE — Progress Notes (Signed)
Patient ID: Freada Bergeron, female   DOB: 20-Jul-1980, 31 y.o.   MRN: [id number] Pt is 31 y.o. and at [redacted]w[redacted]d   .Subjective: Pt is currently sleeping    Objective: BP 102/60  Pulse 64  Temp 98.2 F (36.8 C) (Oral)  Resp 18  Ht 5\' 9"  (1.753 m)  Wt 230 lb (104.327 kg)  BMI 33.96 kg/m2  SpO2 98%  LMP 08/28/2011      FHT:  130, mod variability, +accels, no decels  UC:   irregular, every 3-5 minutes SVE:   Dilation: 4 Effacement (%): 70 Station: -2 Exam by:: Sanda Klein, CNM  Deferred   Assessment:  IOL - elective Inadequate MVU's  FHR cat 1 Pain control: epidural   Plan:  Continue pitocin titration for adequate MVU's  Recheck cervix after adequate or PRN  Update physician PRN Caylei Sperry M 06/05/2012, 5:42 AM

## 2012-06-05 NOTE — Progress Notes (Signed)
Patient ID: [name], female   DOB: 1981-05-16, 31 y.o.   MRN: [id number] Pt is 31 y.o. and at [redacted]w[redacted]d   .Subjective: Comfortable w epidural, sleepy now   Objective: BP 118/69  Pulse 118  Temp 98.4 F (36.9 C) (Oral)  Resp 18  Ht 5\' 9"  (1.753 m)  Wt 230 lb (104.327 kg)  BMI 33.96 kg/m2  SpO2 100%  LMP 08/28/2011   FHT:  FHR: 130 bpm, variability: moderate,  accelerations:  Present,  decelerations:  Absent UC:   irregular, every 2-5 minutes SVE:   Dilation: 4 Effacement (%): 70 Station: -2 Exam by:: [name], CNM  AROM - lg amt clear, pink tinged fluid IUPC placed without difficulty  Vtx descending well   Assessment / Plan: Induction of labor due to term with favorable cervix,  progressing well on pitocin   Fetal Wellbeing:  Category I Pain Control:  Epidural  Plan:  Place foley catheter Titrate pitocin for adequate MVU's Recheck cervix 2hrs or PRN Anticipate NSVD   Update physician PRN  [name] M 06/05/2012, 3:10 AM

## 2012-06-05 NOTE — Anesthesia Postprocedure Evaluation (Signed)
  Anesthesia Post-op Note  Patient: [name]  Procedure(s) Performed: * No procedures listed *  Patient Location: Mother/Baby  Anesthesia Type:Epidural  Level of Consciousness: awake  Airway and Oxygen Therapy: Patient Spontanous Breathing  Post-op Pain: none  Post-op Assessment: Patient's Cardiovascular Status Stable, Respiratory Function Stable, Patent Airway, No signs of Nausea or vomiting, Adequate PO intake, Pain level controlled, No headache, No backache, No residual numbness and No residual motor weakness  Post-op Vital Signs: Reviewed and stable  Complications: No apparent anesthesia complications

## 2012-06-05 NOTE — Anesthesia Procedure Notes (Signed)

## 2012-06-06 LAB — CBC
HCT: 31.8 % — ABNORMAL LOW (ref 36.0–46.0)
MCH: 30.3 pg (ref 26.0–34.0)
MCHC: 34 g/dL (ref 30.0–36.0)
MCV: 89.3 fL (ref 78.0–100.0)
Platelets: 162 10*3/uL (ref 150–400)
RDW: 13.1 % (ref 11.5–15.5)
WBC: 9.6 10*3/uL (ref 4.0–10.5)

## 2012-06-06 LAB — URINE CULTURE: Colony Count: 3000

## 2012-06-06 NOTE — Clinical Social Work Note (Deleted)
CSW spoke with [name]. PPD was over 6 years ago. No concern this pregnancy. [name] aware of symptoms to look out for.  Patient was referred for history of depression/anxiety.  * Referral screened out by Clinical Social Worker because none of the following criteria appear to apply:  ~ History of anxiety/depression during this pregnancy, or of post-partum depression.  ~ Diagnosis of anxiety and/or depression within last 3 years  ~ History of depression due to pregnancy loss/loss of child  OR  * Patient's symptoms currently being treated with medication and/or therapy.  Please contact the Clinical Social Worker if needs arise, or by the patient's request.  

## 2012-06-06 NOTE — Progress Notes (Signed)
Post Partum Day 1:S/P 1 Subjective: Patient up ad lib, denies syncope or dizziness. Feeding:  Breast Contraceptive plan:   Nexplanon at 6 weeks  Objective: Blood pressure 104/73, pulse 76, temperature 97.7 F (36.5 C), temperature source Oral, resp. rate 18, height 5\' 9"  (1.753 m), weight 230 lb (104.327 kg), last menstrual period 08/28/2011, SpO2 99.00%, unknown if currently breastfeeding.  Physical Exam:  General: alert Lochia: appropriate Uterine Fundus: firm Incision: healing well, small periurethral tear, not repaired DVT Evaluation: No evidence of DVT seen on physical exam. Negative Homan's sign.   Basename 06/06/12 0541 06/04/12 2020  HGB 10.8* 11.0*  HCT 31.8* 32.8*    Assessment/Plan: S/P Vaginal delivery day 1 Continue current care Plan for discharge tomorrow   LOS: 2 days   Carin Shipp 06/06/2012, 9:11 AM

## 2012-06-06 NOTE — Clinical Social Work Note (Signed)
[name] has Bipolar diagnosis.  Currently sees a therapist for counseling and psychiatrist for medication management (lamictal).  No current mood concerns.  Patient was referred for history of depression/anxiety.  * Referral screened out by Clinical Social Worker because none of the following criteria appear to apply:  ~ History of anxiety/depression during this pregnancy, or of post-partum depression. ~ Diagnosis of anxiety and/or depression within last 3 years ~ History of depression due to pregnancy loss/loss of child  OR  * Patient's symptoms currently being treated with medication and/or therapy.  Please contact the Clinical Social Worker if needs arise, or by the patient's request.

## 2012-06-07 MED ORDER — OXYCODONE-ACETAMINOPHEN 5-325 MG PO TABS: 1.0000 | ORAL_TABLET | ORAL | Status: DC | PRN

## 2012-06-07 MED ORDER — IBUPROFEN 600 MG PO TABS: 600.0000 mg | ORAL_TABLET | Freq: Four times a day (QID) | ORAL | Status: DC

## 2012-06-07 NOTE — Discharge Summary (Signed)
  Vaginal Delivery Discharge Summary  [name]  DOB:    Sep 20, 1980 MRN:    [id number] CSN:    [id number]  Date of admission:                  06/04/2012  Date of discharge:                   06/07/2012  Procedures this admission: GBS prophylaxis, pitocin, epidural, NSVD  Newborn Data:  Live born female  Birth Weight: 8 lb 8.9 oz (3880 g) APGAR: 8, 9  Home with mother. Name: [name]  History of Present Illness:  [name] is a 31 y.o. female, [phone], who presents at [redacted]w[redacted]d weeks gestation. The patient has been followed at the [facility]. She was admitted induction of labor. Her pregnancy has been complicated by: none.  Hospital course:  The patient was admitted for IOL.   Her labor was not complicated. She proceeded to have a vaginal delivery of a healthy infant. Her delivery was not complicated. Her postpartum course was not complicated.  She was discharged to home on postpartum day 2 doing well.  Feeding:  breast  Contraception:  nexplanom  Discharge hemoglobin:  Hemoglobin  Date Value Range Status  06/06/2012 10.8* 12.0 - 15.0 g/dL Final     HCT  Date Value Range Status  06/06/2012 31.8* 36.0 - 46.0 % Final    Discharge Physical Exam:   General: no distress Lochia: appropriate Uterine Fundus: firm Incision: healing well DVT Evaluation: No evidence of DVT seen on physical exam.  Intrapartum Procedures: spontaneous vaginal delivery and GBS prophylaxis Postpartum Procedures: none Complications-Operative and Postpartum: none  Discharge Diagnoses: Term Pregnancy-delivered  Discharge Information:  Activity:           pelvic rest Diet:                routine Medications: PNV, Ibuprofen and Percocet Condition:      stable Instructions:  refer to practice specific booklet Discharge to: home     [name] M 06/07/2012

## 2012-06-18 ENCOUNTER — Ambulatory Visit (HOSPITAL_COMMUNITY): Payer: Medicaid Other

## 2012-07-16 NOTE — Progress Notes (Signed)
Subjective:     [name] is a 32 y.o. female who presents for a postpartum visit. Pregnancy history remarkable for: Patient Active Problem List  Diagnosis  . Bipolar 2 disorder  . PTSD (post-traumatic stress disorder)  . HSV-2 (herpes simplex virus 2) infection  . Migraines  . E-coli UTI  . GBS (group B Streptococcus carrier), +RV culture, currently pregnant  . NSVD (normal spontaneous vaginal delivery)   SVB, with HS, small periurethral laceration, no repair required.  PPDS:  0.  Denies any issues.    I have fully reviewed the prenatal and intrapartum course.  Feeding:  Breast Contraception:   Nexplanon desired   Patient has not been sexually active.   The following portions of the patient's history were reviewed and updated as appropriate: allergies, current medications, past family history, past medical history, past social history, past surgical history and problem list.  Review of Systems Pertinent items are noted in HPI.   Objective:    Breastfeeding? Unknown  General:  alert, cooperative and no distress     Lungs: clear to auscultation bilaterally  Heart:  regular rate and rhythm, S1, S2 normal, no murmur  Abdomen: soft, non-tender; bowel sounds normal; no masses,  no organomegaly   Vulva:  normal  Vagina: normal vagina  Cervix:  normal   Uterus: : normal size, contour, position, consistency, mobility, non-tender  Adnexa:  normal adnexa             Assessment:     Normal postpartum exam.  Pap smear not done at today's visit.   Due 10/2011 Wants Nexplanon  Plan:  Schedule for Nexplanon ASAP.  Nigel Bridgeman CNM, MN 07/16/2012 2:52 PM

## 2012-07-17 ENCOUNTER — Ambulatory Visit: Payer: Medicaid Other | Admitting: Obstetrics and Gynecology

## 2012-07-17 NOTE — Progress Notes (Signed)
Dreanna Kyllo  is 6 weeks postpartum following a spontaneous vaginal delivery at [redacted]w[redacted]d  gestational weeks Date: 06/05/12 female baby named [place] delivered by [employer], CNM.  Breastfeeding: no Bottlefeeding:  yes  Post-partum blues / depression: no  EPDS score: 0  History of abnormal Pap:  no  Last Pap: Date  11/13/11 Gestational diabetes:  no  Contraception:  Desires Nexplanon ASAP insurance will end 08/06/12  Normal urinary function:  yes Normal GI function:  yes Returning to work:  no

## 2012-07-28 ENCOUNTER — Encounter: Payer: Medicaid Other | Admitting: Obstetrics and Gynecology

## 2012-07-28 DIAGNOSIS — Z309 Encounter for contraceptive management, unspecified: Secondary | ICD-10-CM

## 2012-07-29 ENCOUNTER — Encounter: Payer: Self-pay | Admitting: Obstetrics and Gynecology

## 2012-07-29 ENCOUNTER — Ambulatory Visit: Payer: Medicaid Other | Admitting: Obstetrics and Gynecology

## 2012-07-29 VITALS — BP 112/80 | HR 78

## 2012-07-29 DIAGNOSIS — Z30017 Encounter for initial prescription of implantable subdermal contraceptive: Secondary | ICD-10-CM

## 2012-07-29 DIAGNOSIS — IMO0001 Reserved for inherently not codable concepts without codable children: Secondary | ICD-10-CM

## 2012-07-29 MED ORDER — ETONOGESTREL 68 MG ~~LOC~~ IMPL
68.0000 mg | DRUG_IMPLANT | Freq: Once | SUBCUTANEOUS | Status: AC
  Administered 2012-07-29: 68 mg via SUBCUTANEOUS

## 2012-07-29 NOTE — Patient Instructions (Signed)
Call [facility] [phone]:  -for temperature of 100.4 degrees Fahrenheit or more -pain not improved with over the counter pain medications (Ibuprofen, Advil, Aleve,     Tylenol or acetaminophen) -for excessive bleeding from insertion site -for excessive swelling redness or green drainage from your insertion site -for any other concerns -keep insertion site clean, dry and covered  for 24 hours -you may remove pressure bandage in 1-4 hours  Use a back-up method of birth control for the next 4 weeks

## 2012-07-29 NOTE — Progress Notes (Signed)
31 YO for Nexplanon insertion.  Reviewed MOA, effectiveness, side effects, R & B, duration of action and removal process.  Consent obtained.  O:  UPT-negative       Nexplanon inserted per protocol in media left upper arm without difficulty, dressed with sterile band-aid and 4 x 4 gauze pressure bandage with Kling  A: Nexplanon Insertion  P:  RTO-1 week       Call [facility] [phone]:  -for temperature of 100.4 degrees Fahrenheit or more -pain not improved with over the counter pain medications (Ibuprofen, Advil, Aleve,     Tylenol or acetaminophen) -for excessive bleeding from insertion site -for excessive swelling redness or green drainage from your insertion site -for any other concerns -keep insertion site clean, dry and covered  for 24 hours -you may remove pressure bandage in 1-4 hours  Use a back-up method of birth control for the next 4 weeks   Sharmila Wrobleski, PA-C

## 2012-08-04 ENCOUNTER — Encounter: Payer: Medicaid Other | Admitting: Obstetrics and Gynecology

## 2012-08-08 NOTE — Progress Notes (Signed)
erro  neous encounter

## 2013-05-28 ENCOUNTER — Inpatient Hospital Stay (HOSPITAL_COMMUNITY): Payer: Medicaid Other

## 2013-05-28 ENCOUNTER — Encounter (HOSPITAL_COMMUNITY): Payer: Self-pay

## 2013-05-28 ENCOUNTER — Inpatient Hospital Stay (HOSPITAL_COMMUNITY)
Admission: AD | Admit: 2013-05-28 | Discharge: 2013-05-28 | Disposition: A | Discharge status: Home / Self Care | Payer: Medicaid Other | Source: Ambulatory Visit | Attending: Obstetrics & Gynecology | Admitting: Obstetrics & Gynecology

## 2013-05-28 DIAGNOSIS — O209 Hemorrhage in early pregnancy, unspecified: Secondary | ICD-10-CM

## 2013-05-28 DIAGNOSIS — O4690 Antepartum hemorrhage, unspecified, unspecified trimester: Secondary | ICD-10-CM

## 2013-05-28 DIAGNOSIS — O034 Incomplete spontaneous abortion without complication: Secondary | ICD-10-CM | POA: Insufficient documentation

## 2013-05-28 DIAGNOSIS — R109 Unspecified abdominal pain: Secondary | ICD-10-CM | POA: Insufficient documentation

## 2013-05-28 LAB — HCG, QUANTITATIVE, PREGNANCY: hCG, Beta Chain, Quant, S: 1171 m[IU]/mL — ABNORMAL HIGH (ref <5)

## 2013-05-28 LAB — POCT PREGNANCY, URINE: Preg Test, Ur: POSITIVE — AB

## 2013-05-28 LAB — CBC
MCH: 30.1 pg (ref 26.0–34.0)
MCHC: 33.8 g/dL (ref 30.0–36.0)
Platelets: 284 10*3/uL (ref 150–400)
RDW: 12.7 % (ref 11.5–15.5)

## 2013-05-28 LAB — URINALYSIS, ROUTINE W REFLEX MICROSCOPIC
Glucose, UA: NEGATIVE mg/dL
Leukocytes, UA: NEGATIVE
Protein, ur: NEGATIVE mg/dL
Specific Gravity, Urine: 1.01 (ref 1.005–1.030)
pH: 6.5 (ref 5.0–8.0)

## 2013-05-28 LAB — ABO/RH: ABO/RH(D): A POS

## 2013-05-28 LAB — URINE MICROSCOPIC-ADD ON

## 2013-05-28 NOTE — MAU Note (Signed)
Pt presents with complaints of having some bright red vaginal bleeding and abdominal cramping on and off since yesterday. States that she is approximately [redacted] weeks pregnant and has not been evaluated in her physicians office due to her insurance.
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
## 2013-05-29 LAB — GC/CHLAMYDIA PROBE AMP: CT Probe RNA: NEGATIVE

## 2013-06-05 ENCOUNTER — Encounter (HOSPITAL_COMMUNITY): Payer: Medicaid Other | Admitting: Anesthesiology

## 2013-06-05 ENCOUNTER — Inpatient Hospital Stay (HOSPITAL_COMMUNITY)
Admission: AD | Admit: 2013-06-05 | Discharge: 2013-06-05 | Disposition: A | Discharge status: Home / Self Care | Payer: Medicaid Other | Source: Ambulatory Visit | Attending: Family Medicine | Admitting: Family Medicine

## 2013-06-05 ENCOUNTER — Inpatient Hospital Stay (HOSPITAL_COMMUNITY): Payer: Medicaid Other

## 2013-06-05 ENCOUNTER — Inpatient Hospital Stay (HOSPITAL_COMMUNITY): Payer: Medicaid Other | Admitting: Anesthesiology

## 2013-06-05 ENCOUNTER — Encounter (HOSPITAL_COMMUNITY): Payer: Self-pay | Admitting: *Deleted

## 2013-06-05 ENCOUNTER — Encounter (HOSPITAL_COMMUNITY)
Admission: AD | Disposition: A | Discharge status: Home / Self Care | Payer: Self-pay | Source: Ambulatory Visit | Attending: Family Medicine

## 2013-06-05 DIAGNOSIS — O00109 Unspecified tubal pregnancy without intrauterine pregnancy: Secondary | ICD-10-CM | POA: Diagnosis present

## 2013-06-05 HISTORY — PX: LAPAROSCOPIC UNILATERAL SALPINGECTOMY: SHX5934

## 2013-06-05 LAB — CBC
HCT: 40.2 % (ref 36.0–46.0)
MCV: 87.8 fL (ref 78.0–100.0)
RBC: 4.58 MIL/uL (ref 3.87–5.11)
RDW: 12.6 % (ref 11.5–15.5)
WBC: 16.1 10*3/uL — ABNORMAL HIGH (ref 4.0–10.5)

## 2013-06-05 LAB — HCG, QUANTITATIVE, PREGNANCY: hCG, Beta Chain, Quant, S: 455 m[IU]/mL — ABNORMAL HIGH (ref <5)

## 2013-06-05 SURGERY — SALPINGECTOMY, UNILATERAL, LAPAROSCOPIC
Anesthesia: General | Site: Abdomen | Laterality: Right

## 2013-06-05 MED ORDER — ONDANSETRON HCL 4 MG/2ML IJ SOLN
4.0000 mg | Freq: Once | INTRAMUSCULAR | Status: AC
  Administered 2013-06-05: 4 mg via INTRAVENOUS
  Filled 2013-06-05: qty 2

## 2013-06-05 MED ORDER — OXYCODONE-ACETAMINOPHEN 5-325 MG PO TABS
1.0000 | ORAL_TABLET | ORAL | Status: DC | PRN
  Administered 2013-06-05: 1 via ORAL

## 2013-06-05 MED ORDER — PROPOFOL 10 MG/ML IV BOLUS
INTRAVENOUS | Status: DC | PRN
  Administered 2013-06-05: 180 mg via INTRAVENOUS

## 2013-06-05 MED ORDER — KETOROLAC TROMETHAMINE 30 MG/ML IJ SOLN
INTRAMUSCULAR | Status: DC | PRN
  Administered 2013-06-05: 30 mg via INTRAVENOUS

## 2013-06-05 MED ORDER — OXYCODONE-ACETAMINOPHEN 5-325 MG PO TABS: 1.0000 | ORAL_TABLET | Freq: Four times a day (QID) | ORAL | Status: DC | PRN

## 2013-06-05 MED ORDER — FAMOTIDINE IN NACL 20-0.9 MG/50ML-% IV SOLN
20.0000 mg | Freq: Once | INTRAVENOUS | Status: AC
  Administered 2013-06-05: 20 mg via INTRAVENOUS
  Filled 2013-06-05: qty 50

## 2013-06-05 MED ORDER — LIDOCAINE HCL (CARDIAC) 20 MG/ML IV SOLN
INTRAVENOUS | Status: AC
  Filled 2013-06-05: qty 5

## 2013-06-05 MED ORDER — MIDAZOLAM HCL 2 MG/2ML IJ SOLN
INTRAMUSCULAR | Status: DC | PRN
  Administered 2013-06-05: 2 mg via INTRAVENOUS

## 2013-06-05 MED ORDER — NEOSTIGMINE METHYLSULFATE 1 MG/ML IJ SOLN
INTRAMUSCULAR | Status: DC | PRN
  Administered 2013-06-05: 4 mg via INTRAVENOUS

## 2013-06-05 MED ORDER — DEXAMETHASONE SODIUM PHOSPHATE 10 MG/ML IJ SOLN
INTRAMUSCULAR | Status: DC | PRN
  Administered 2013-06-05: 4 mg via INTRAVENOUS
  Administered 2013-06-05: 6 mg via INTRAVENOUS

## 2013-06-05 MED ORDER — LACTATED RINGERS IV SOLN: INTRAVENOUS | Status: DC

## 2013-06-05 MED ORDER — KETOROLAC TROMETHAMINE 30 MG/ML IJ SOLN
INTRAMUSCULAR | Status: AC
  Filled 2013-06-05: qty 1

## 2013-06-05 MED ORDER — PROPOFOL 10 MG/ML IV EMUL
INTRAVENOUS | Status: AC
  Filled 2013-06-05: qty 20

## 2013-06-05 MED ORDER — HYDROMORPHONE HCL PF 1 MG/ML IJ SOLN
1.0000 mg | Freq: Once | INTRAMUSCULAR | Status: AC
  Administered 2013-06-05: 1 mg via INTRAVENOUS
  Filled 2013-06-05: qty 1

## 2013-06-05 MED ORDER — DEXAMETHASONE SODIUM PHOSPHATE 10 MG/ML IJ SOLN
INTRAMUSCULAR | Status: AC
  Filled 2013-06-05: qty 1

## 2013-06-05 MED ORDER — ROCURONIUM BROMIDE 100 MG/10ML IV SOLN
INTRAVENOUS | Status: DC | PRN
  Administered 2013-06-05: 25 mg via INTRAVENOUS

## 2013-06-05 MED ORDER — HYDROMORPHONE HCL PF 1 MG/ML IJ SOLN: 0.2500 mg | INTRAMUSCULAR | Status: DC | PRN

## 2013-06-05 MED ORDER — ONDANSETRON HCL 4 MG/2ML IJ SOLN
INTRAMUSCULAR | Status: DC | PRN
  Administered 2013-06-05: 4 mg via INTRAVENOUS

## 2013-06-05 MED ORDER — GLYCOPYRROLATE 0.2 MG/ML IJ SOLN
INTRAMUSCULAR | Status: DC | PRN
  Administered 2013-06-05: .8 mg via INTRAVENOUS

## 2013-06-05 MED ORDER — ONDANSETRON HCL 4 MG/2ML IJ SOLN
INTRAMUSCULAR | Status: AC
  Filled 2013-06-05: qty 2

## 2013-06-05 MED ORDER — FENTANYL CITRATE 0.05 MG/ML IJ SOLN
INTRAMUSCULAR | Status: DC | PRN
  Administered 2013-06-05: 75 ug via INTRAVENOUS
  Administered 2013-06-05: 50 ug via INTRAVENOUS
  Administered 2013-06-05: 25 ug via INTRAVENOUS
  Administered 2013-06-05 (×2): 50 ug via INTRAVENOUS

## 2013-06-05 MED ORDER — MEPERIDINE HCL 25 MG/ML IJ SOLN: 6.2500 mg | INTRAMUSCULAR | Status: DC | PRN

## 2013-06-05 MED ORDER — CITRIC ACID-SODIUM CITRATE 334-500 MG/5ML PO SOLN
30.0000 mL | Freq: Once | ORAL | Status: AC
  Administered 2013-06-05: 30 mL via ORAL
  Filled 2013-06-05: qty 15

## 2013-06-05 MED ORDER — MIDAZOLAM HCL 2 MG/2ML IJ SOLN
INTRAMUSCULAR | Status: AC
  Filled 2013-06-05: qty 2

## 2013-06-05 MED ORDER — GLYCOPYRROLATE 0.2 MG/ML IJ SOLN
INTRAMUSCULAR | Status: AC
  Filled 2013-06-05: qty 1

## 2013-06-05 MED ORDER — FENTANYL CITRATE 0.05 MG/ML IJ SOLN
INTRAMUSCULAR | Status: AC
  Filled 2013-06-05: qty 5

## 2013-06-05 MED ORDER — OXYCODONE-ACETAMINOPHEN 5-325 MG PO TABS
ORAL_TABLET | ORAL | Status: AC
  Filled 2013-06-05: qty 1

## 2013-06-05 MED ORDER — LACTATED RINGERS IV SOLN
INTRAVENOUS | Status: DC
  Administered 2013-06-05 (×3): via INTRAVENOUS

## 2013-06-05 MED ORDER — SUCCINYLCHOLINE CHLORIDE 20 MG/ML IJ SOLN
INTRAMUSCULAR | Status: DC | PRN
  Administered 2013-06-05: 120 mg via INTRAVENOUS

## 2013-06-05 MED ORDER — LIDOCAINE HCL (CARDIAC) 20 MG/ML IV SOLN
INTRAVENOUS | Status: DC | PRN
  Administered 2013-06-05: 30 mg via INTRAVENOUS

## 2013-06-05 MED ORDER — PROMETHAZINE HCL 25 MG/ML IJ SOLN: 6.2500 mg | INTRAMUSCULAR | Status: DC | PRN

## 2013-06-05 MED ORDER — BUPIVACAINE HCL 0.25 % IJ SOLN
INTRAMUSCULAR | Status: DC | PRN
  Administered 2013-06-05: 4 mL
  Administered 2013-06-05: 3 mL
  Administered 2013-06-05: 2 mL
  Administered 2013-06-05: 1 mL

## 2013-06-05 MED ORDER — ROCURONIUM BROMIDE 100 MG/10ML IV SOLN
INTRAVENOUS | Status: AC
  Filled 2013-06-05: qty 1

## 2013-06-05 SURGICAL SUPPLY — 24 items
BLADE SURG 15 STRL LF C SS BP (BLADE) ×1 IMPLANT
BLADE SURG 15 STRL SS (BLADE) ×1
COVER MAYO STAND STRL (DRAPES) ×2 IMPLANT
DERMABOND ADVANCED (GAUZE/BANDAGES/DRESSINGS) ×1
DERMABOND ADVANCED .7 DNX12 (GAUZE/BANDAGES/DRESSINGS) ×1 IMPLANT
ELECT REM PT RETURN 9FT ADLT (ELECTROSURGICAL) ×2
ELECTRODE REM PT RTRN 9FT ADLT (ELECTROSURGICAL) ×1 IMPLANT
GLOVE BIOGEL PI IND STRL 6.5 (GLOVE) ×2 IMPLANT
GLOVE BIOGEL PI IND STRL 7.0 (GLOVE) ×4 IMPLANT
GLOVE BIOGEL PI INDICATOR 6.5 (GLOVE) ×2
GLOVE BIOGEL PI INDICATOR 7.0 (GLOVE) ×4
GLOVE SKINSENSE NS SZ6.5 (GLOVE) ×2
GLOVE SKINSENSE STRL SZ6.5 (GLOVE) ×2 IMPLANT
GOWN SURGICAL XLG (GOWNS) ×6 IMPLANT
PACK LAPAROSCOPY BASIN (CUSTOM PROCEDURE TRAY) ×2 IMPLANT
POUCH SPECIMEN RETRIEVAL 10MM (ENDOMECHANICALS) ×2 IMPLANT
SET IRRIG TUBING LAPAROSCOPIC (IRRIGATION / IRRIGATOR) ×2 IMPLANT
SHEARS HARMONIC ACE PLUS 36CM (ENDOMECHANICALS) ×2 IMPLANT
SUT VICRYL 0 UR6 27IN ABS (SUTURE) ×4 IMPLANT
TOWEL NATURAL 6PK STERILE (DISPOSABLE) ×4 IMPLANT
TRAY FOLEY BAG SILVER LF 14FR (CATHETERS) ×2 IMPLANT
TROCAR BALLN 12MMX100 BLUNT (TROCAR) ×2 IMPLANT
TROCAR XCEL NON-BLD 5MMX100MML (ENDOMECHANICALS) ×2 IMPLANT
TROCAR XCEL OPT SLVE 5M 100M (ENDOMECHANICALS) ×2 IMPLANT
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
## 2013-06-05 NOTE — Anesthesia Preprocedure Evaluation (Signed)
Anesthesia Evaluation  Patient identified by MRN, date of birth, ID band Patient awake    Reviewed: Allergy & Precautions, H&P , NPO status , Patient's Chart, lab work & pertinent test results  Airway Mallampati: II TM Distance: >3 FB Neck ROM: Full    Dental no notable dental hx. (+) Teeth Intact   Pulmonary neg pulmonary ROS, former smoker,  breath sounds clear to auscultation  Pulmonary exam normal       Cardiovascular negative cardio ROS  Rhythm:Regular Rate:Normal     Neuro/Psych Bipolar Disorder negative neurological ROS  negative psych ROS   GI/Hepatic negative GI ROS, Neg liver ROS,   Endo/Other  negative endocrine ROS  Renal/GU negative Renal ROS  negative genitourinary   Musculoskeletal negative musculoskeletal ROS (+)   Abdominal   Peds negative pediatric ROS (+)  Hematology negative hematology ROS (+)   Anesthesia Other Findings   Reproductive/Obstetrics negative OB ROS                           Anesthesia Physical Anesthesia Plan  ASA: II and emergent  Anesthesia Plan: General   Post-op Pain Management:    Induction: Intravenous, Rapid sequence and Cricoid pressure planned  Airway Management Planned: Oral ETT  Additional Equipment:   Intra-op Plan:   Post-operative Plan: Extubation in OR  Informed Consent: I have reviewed the patients History and Physical, chart, labs and discussed the procedure including the risks, benefits and alternatives for the proposed anesthesia with the patient or authorized representative who has indicated his/her understanding and acceptance.   Dental advisory given  Plan Discussed with: CRNA  Anesthesia Plan Comments:         Anesthesia Quick Evaluation
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
## 2013-06-05 NOTE — Op Note (Signed)
[name]  PROCEDURE DATE: 06/05/2013  PREOPERATIVE DIAGNOSIS: Ruptured ectopic pregnancy  POSTOPERATIVE DIAGNOSIS: Ruptured right fallopian tube ectopic pregnancy  PROCEDURE: Laparoscopic right salpingectomy   SURGEON:  Dr. Tinnie Gens  ASSISTANT: None  ANESTHESIOLOGIST: Phillips Grout, MD  INDICATIONS: 32 y.o. [id number] at [redacted]w[redacted]d here for with ruptured ectopic pregnancy with blood type A pos. Patient was counseled regarding need for laparoscopic salpingectomy. Risks of surgery including bleeding which may require transfusion or reoperation, infection, injury to bowel or other surrounding organs, need for additional procedures including laparotomy and other postoperative/anesthesia complications were explained to patient.  Written informed consent was obtained.  FINDINGS:  Moderate amount of hemoperitoneum estimated to be about 400 cc of blood and clots.  Normal caliber right fallopian with attached clot and tissue extending from end.  Small normal appearing uterus, normal left fallopian tube, right ovary and left ovary.  ANESTHESIA: General  SPECIMENS: right fallopian tube to pathology  COMPLICATIONS: None immediate  PROCEDURE IN DETAIL:  The patient was taken to the operating room where general anesthesia was administered and was found to be adequate.  She was placed in the dorsal lithotomy position, and was prepped and draped in a sterile manner.  A Foley catheter was inserted into her bladder and attached to constant drainage and a uterine manipulator was then advanced into the uterus .  After an adequate timeout was performed, attention was then turned to the [name] abdomen where a 10-mm skin incision was made on the umbilical fold. Fascia and peritoneum were entered sharply.  Two 0 Vicryl sutures were used to tag the fascia bilaterally.  A Hassan trocar was placed. The laparoscope was introduced.  A survey of the [name] pelvis and abdomen revealed the findings as above.   Lower quadrant ports were placed under direct visualization; 2 LLQ 5-mm on the left, both 5-mm.  Attention was then turned to the right fallopian tube which was grasped and ligated from the underlying mesosalpinx and uterine attachment using the Harmonic scalpel.  Good hemostasis was noted.  The specimen was placed in an EndoCatch bag and removed from the abdomen intact.  The abdomen was desufflated, and all instruments were removed.  The umbilicus incision was closed with the afore mentioned Vicryl sutures in two  figure-of-eight stiches; and all skin incisions were closed with a 3-0 Vicryl subcuticular stitch. The patient tolerated the procedures well.  All instruments, needles, and sponge counts were correct x 2. The patient was taken to the recovery room in stable condition.   Mallory Mano S MD 06/05/2013 7:07 PM

## 2013-06-05 NOTE — Anesthesia Postprocedure Evaluation (Signed)
  Anesthesia Post-op Note  Patient: [name]  Procedure(s) Performed: Procedure(s) (LRB): LAPAROSCOPIC RIGHT SALPINGECTOMY (Right)  Patient Location: PACU  Anesthesia Type: General  Level of Consciousness: awake and alert   Airway and Oxygen Therapy: Patient Spontanous Breathing  Post-op Pain: mild  Post-op Assessment: Post-op Vital signs reviewed, Patient's Cardiovascular Status Stable, Respiratory Function Stable, Patent Airway and No signs of Nausea or vomiting  Last Vitals:  Filed Vitals:   06/05/13 1930  BP: 134/72  Pulse: 83  Temp:   Resp: 27    Post-op Vital Signs: stable   Complications: No apparent anesthesia complications

## 2013-06-05 NOTE — Transfer of Care (Signed)
Immediate Anesthesia Transfer of Care Note  Patient: [name]  Procedure(s) Performed: Procedure(s): LAPAROSCOPIC RIGHT SALPINGECTOMY (Right)  Patient Location: PACU  Anesthesia Type:General  Level of Consciousness: sedated  Airway & Oxygen Therapy: Patient Spontanous Breathing and Patient connected to nasal cannula oxygen  Post-op Assessment: Report given to PACU RN and Post -op Vital signs reviewed and stable  Post vital signs: stable  Complications: No apparent anesthesia complications

## 2013-06-05 NOTE — MAU Note (Signed)
Pt reprots she had a miscarriage last week. bleeding and pain had stopped and she was feeling well up until 2 hrs ago. She had sudden onset of lower abd pain on right side shooting to her vagina. Pt got nauseated and vomited .Pain is still there.

## 2013-06-05 NOTE — MAU Provider Note (Signed)
Chart reviewed and agree with management and plan.  

## 2013-06-07 ENCOUNTER — Encounter (HOSPITAL_COMMUNITY): Payer: Self-pay | Admitting: Family Medicine

## 2013-07-01 ENCOUNTER — Ambulatory Visit: Payer: Medicaid Other | Admitting: Family Medicine

## 2013-07-16 ENCOUNTER — Telehealth: Payer: Self-pay | Admitting: General Practice

## 2013-07-16 NOTE — Telephone Encounter (Signed)
Patient called and left a message stating she had surgery 12/20 for an ectopic pregnancy and she missed her follow up appt with us and thinks that she has a yeast infection in her belly button and would like a call back. Called patient and she stated that her belly button has yellowish d/c and odor to it where her incision was and she tried putting hydrogen peroxide on it because that's what her dad suggested because he has a PhD and the hydrogen peroxide helped and then she started to put this over the counter antifungal cream on it and if she stops using the cream everything comes back. Spoke to Dr Erin FullingHarraway smith who stated to have patient stop current treatments and use OTC triple antibiotic ointment and to come in early next week for post op. Told patient to discontinue the hydrogen peroxide and antifungal and to start using an OTC triple antibiotic ointment and asked if she could come in 2/3 @ 1:30 for post op. Patient stated that she could. Patient had no further questions

## 2013-07-20 ENCOUNTER — Ambulatory Visit: Payer: Medicaid Other | Admitting: Family Medicine

## 2013-12-26 IMAGING — US US OB TRANSVAGINAL
1 series · 14 of 28 positions shown · non-contrast
Comparison: 05/28/2013

CLINICAL DATA: 32-year-old female with severe right pelvic pain.
Beta HCG of 455.

EXAM:
TRANSVAGINAL OB ULTRASOUND
TECHNIQUE: Transvaginal ultrasound was performed for complete evaluation of the
gestation as well as the maternal uterus, adnexal regions, and
pelvic cul-de-sac.

[Series 1: us ob transvaginal · 29 acquisitions, 14 frames shown]
[im 2/29]
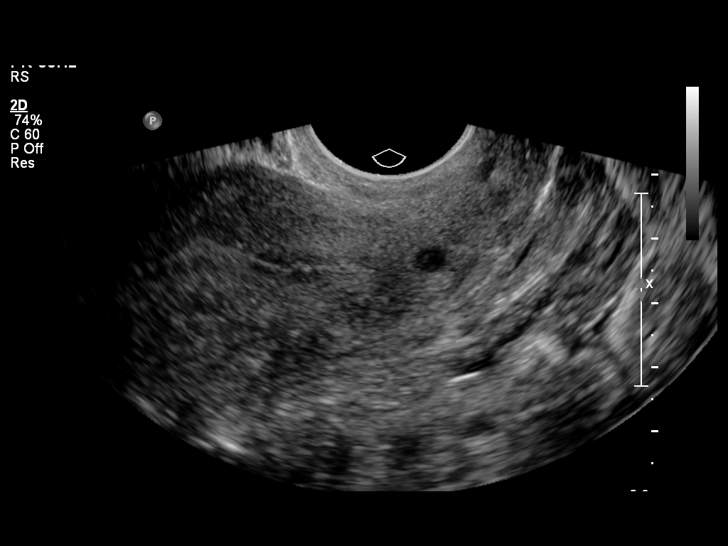
[im 4/29]
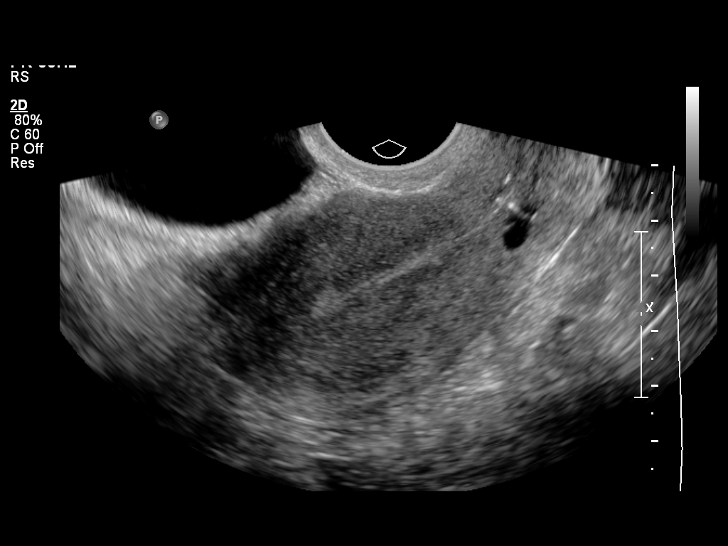
[im 6/29]
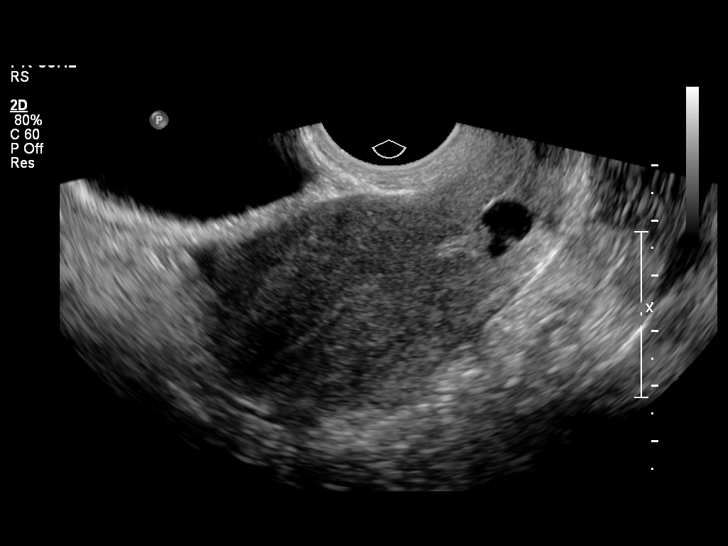
[im 8/29]
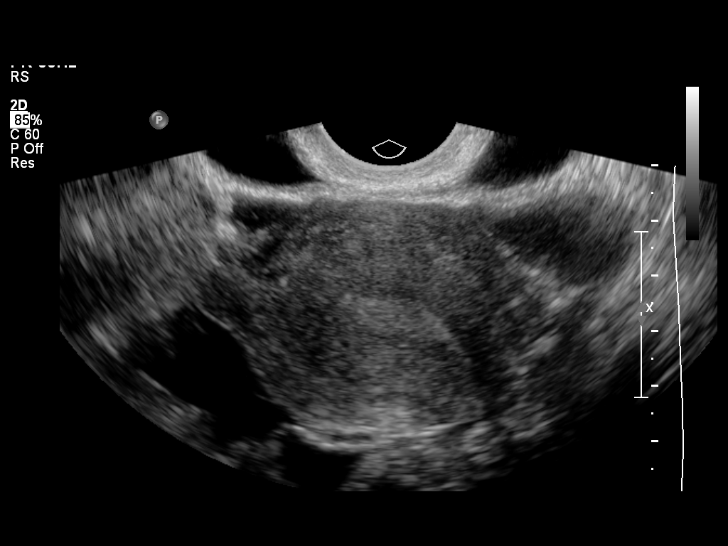
[im 10/29]
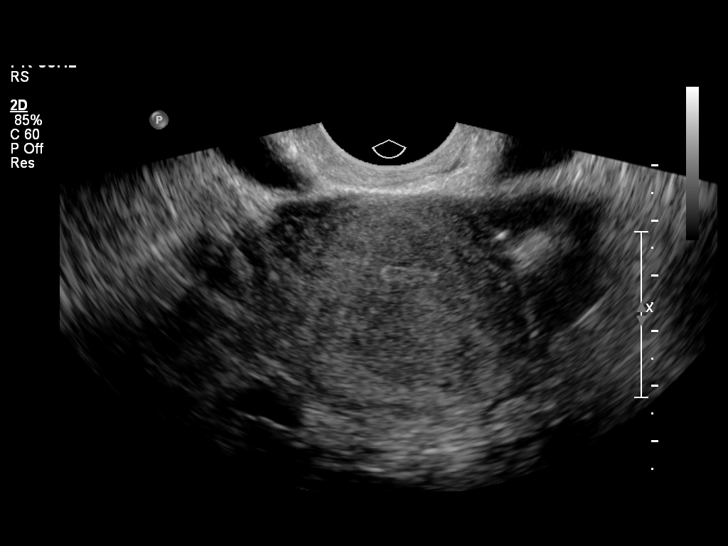
[im 12/29]
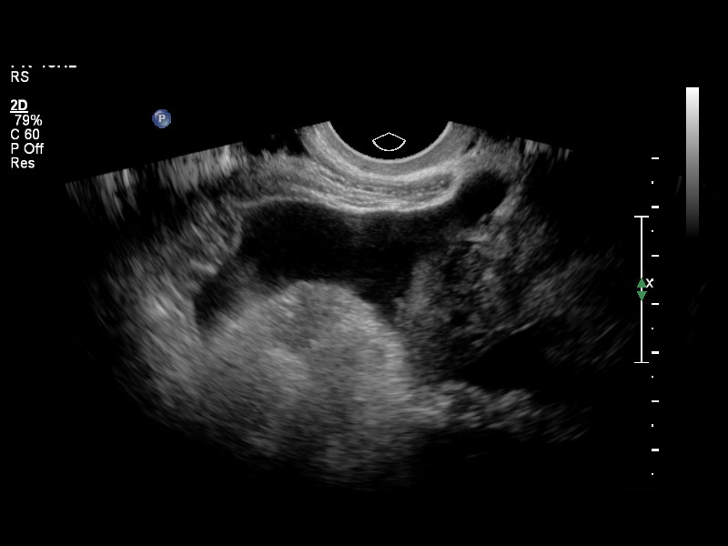
[im 14/29]
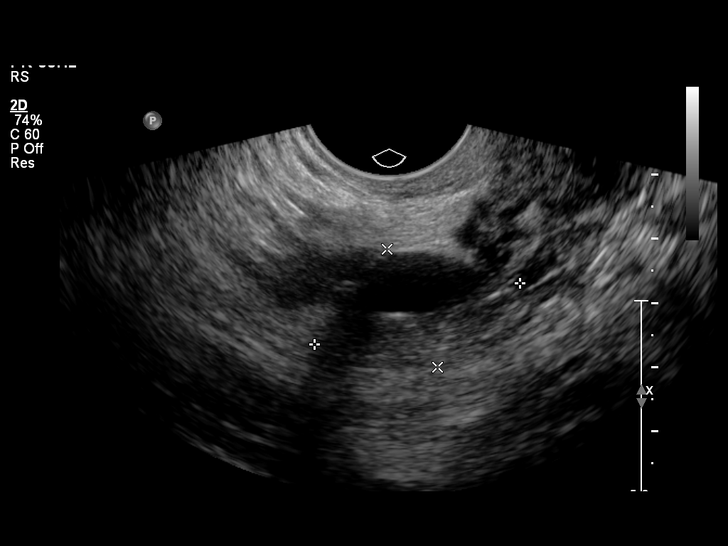
[im 16/29]
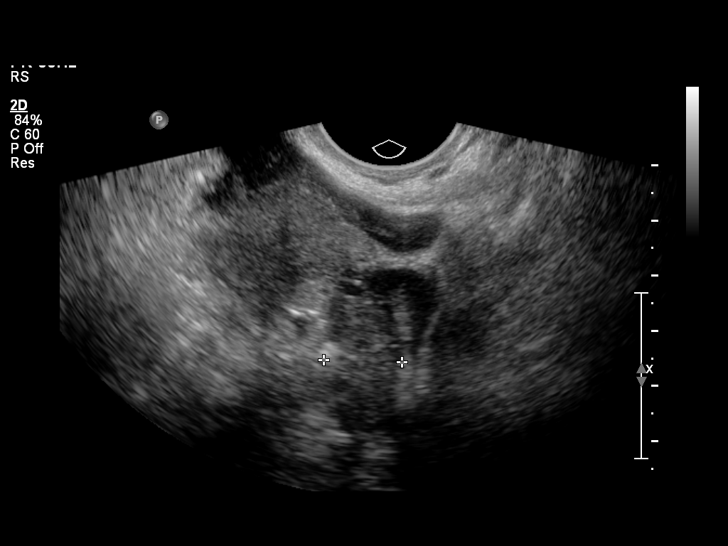
[im 18/29]
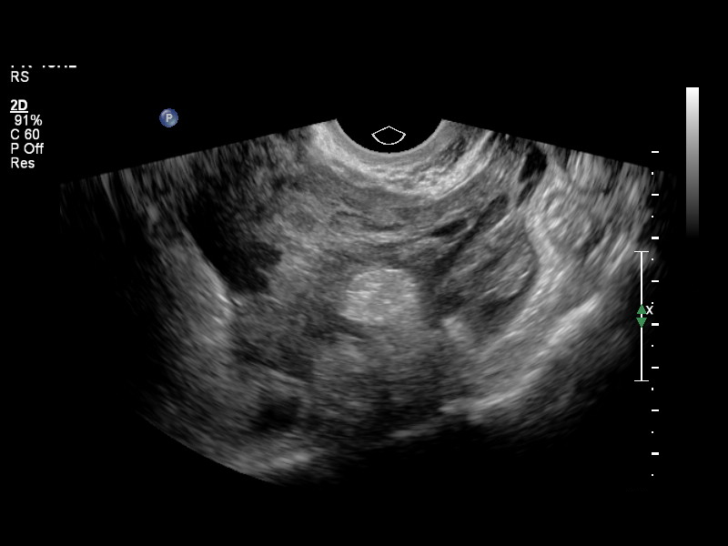
[im 20/29]
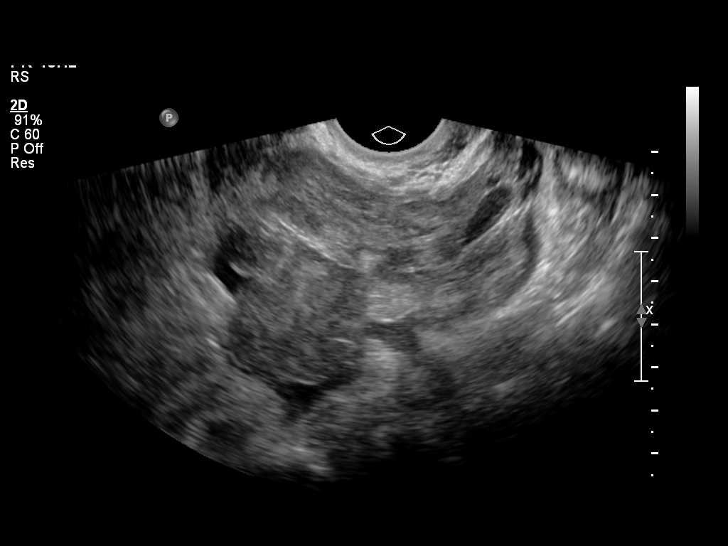
[im 22/29]
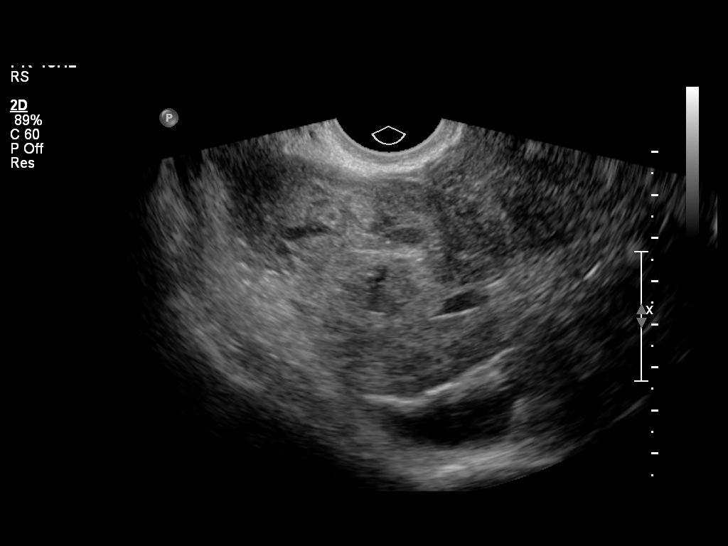
[im 24/29]
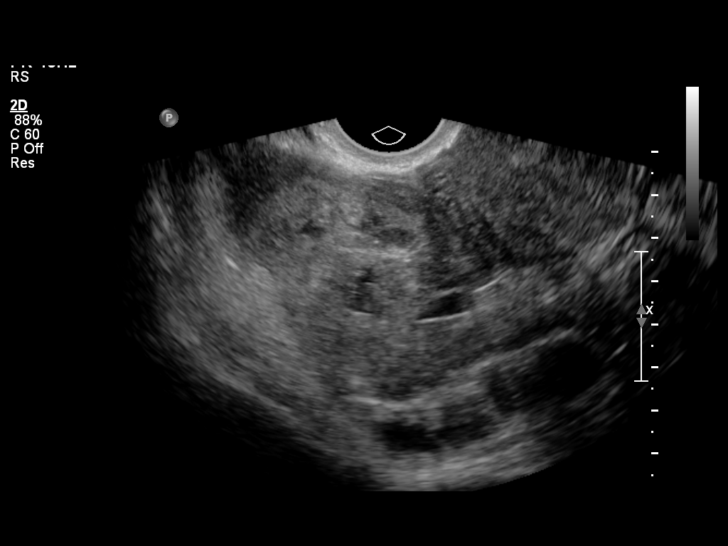
[im 26/29]
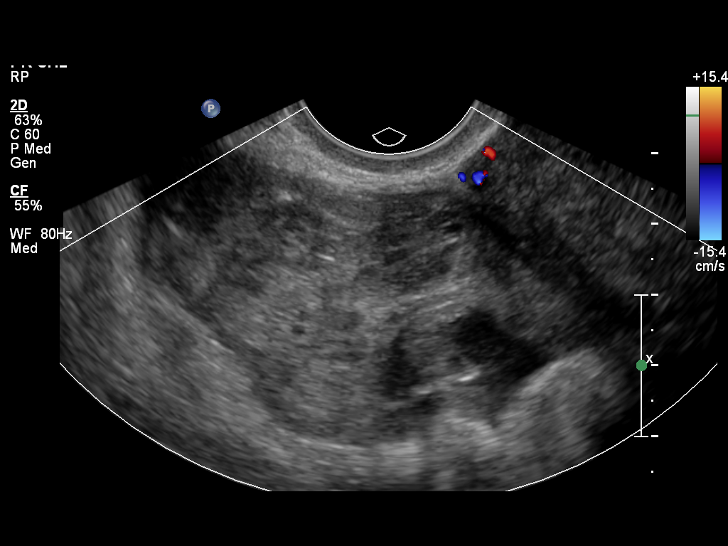
[im 29/29]
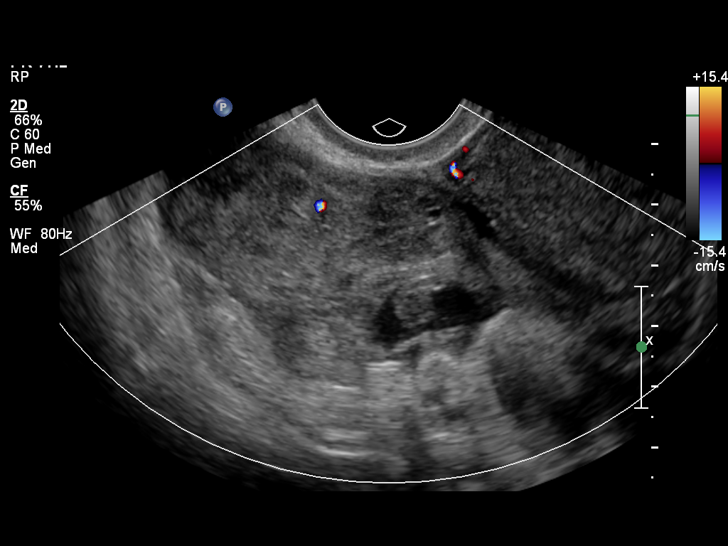

[14 of 28 positions shown; findings below may reference images not displayed]

FINDINGS: Intrauterine gestational sac:  None visualized

Yolk sac:  Not visualized

Embryo:  Not visualized

Cardiac Activity: Not visualized

Maternal uterus/adnexae: A 7.7 x 4.5 x 6.4 cm complex right adnexal
mass is noted with moderate free pelvic fluid/blood compatible with
ruptured ectopic pregnancy.

The left ovary is unremarkable.

A nabothian cyst is again identified.
IMPRESSION: 7.7 x 4.5 x 6.5 cm complex right adnexal mass with moderate free
fluid/blood compatible with ruptured ectopic pregnancy.

Critical Value/emergent results were called by telephone at the time
of interpretation on 06/05/2013 at [DATE] to Dr. Sommer , who
verbally acknowledged these results.

## 2014-04-05 ENCOUNTER — Encounter (HOSPITAL_COMMUNITY): Payer: Self-pay

## 2014-04-05 ENCOUNTER — Inpatient Hospital Stay (HOSPITAL_COMMUNITY)
Admission: AD | Admit: 2014-04-05 | Discharge: 2014-04-05 | Disposition: A | Discharge status: Home / Self Care | Payer: Medicaid Other | Source: Ambulatory Visit | Attending: Obstetrics & Gynecology | Admitting: Obstetrics & Gynecology

## 2014-04-05 ENCOUNTER — Inpatient Hospital Stay (HOSPITAL_COMMUNITY): Payer: Medicaid Other

## 2014-04-05 DIAGNOSIS — Z87891 Personal history of nicotine dependence: Secondary | ICD-10-CM | POA: Diagnosis not present

## 2014-04-05 DIAGNOSIS — O9989 Other specified diseases and conditions complicating pregnancy, childbirth and the puerperium: Secondary | ICD-10-CM | POA: Insufficient documentation

## 2014-04-05 DIAGNOSIS — R109 Unspecified abdominal pain: Secondary | ICD-10-CM | POA: Diagnosis present

## 2014-04-05 DIAGNOSIS — N949 Unspecified condition associated with female genital organs and menstrual cycle: Secondary | ICD-10-CM | POA: Insufficient documentation

## 2014-04-05 DIAGNOSIS — R102 Pelvic and perineal pain: Secondary | ICD-10-CM

## 2014-04-05 DIAGNOSIS — O26899 Other specified pregnancy related conditions, unspecified trimester: Secondary | ICD-10-CM

## 2014-04-05 LAB — CBC
HCT: 38.2 % (ref 36.0–46.0)
Hemoglobin: 13.1 g/dL (ref 12.0–15.0)
MCH: 31 pg (ref 26.0–34.0)
MCHC: 34.3 g/dL (ref 30.0–36.0)
MCV: 90.3 fL (ref 78.0–100.0)
PLATELETS: 260 10*3/uL (ref 150–400)
RBC: 4.23 MIL/uL (ref 3.87–5.11)
RDW: 12.1 % (ref 11.5–15.5)
WBC: 8.2 10*3/uL (ref 4.0–10.5)

## 2014-04-05 LAB — WET PREP, GENITAL
Clue Cells Wet Prep HPF POC: NONE SEEN
Trich, Wet Prep: NONE SEEN
Yeast Wet Prep HPF POC: NONE SEEN

## 2014-04-05 LAB — HCG, QUANTITATIVE, PREGNANCY: HCG, BETA CHAIN, QUANT, S: 32281 m[IU]/mL — AB (ref <5)

## 2014-04-05 LAB — URINALYSIS, ROUTINE W REFLEX MICROSCOPIC
Bilirubin Urine: NEGATIVE
Glucose, UA: NEGATIVE mg/dL
Hgb urine dipstick: NEGATIVE
Ketones, ur: NEGATIVE mg/dL
LEUKOCYTES UA: NEGATIVE
NITRITE: NEGATIVE
PROTEIN: NEGATIVE mg/dL
Specific Gravity, Urine: 1.015 (ref 1.005–1.030)
Urobilinogen, UA: 0.2 mg/dL (ref 0.0–1.0)
pH: 6.5 (ref 5.0–8.0)

## 2014-04-05 LAB — POCT PREGNANCY, URINE: PREG TEST UR: POSITIVE — AB

## 2014-04-05 LAB — OB RESULTS CONSOLE GC/CHLAMYDIA
Chlamydia: NEGATIVE
Gonorrhea: NEGATIVE
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
## 2014-04-05 NOTE — MAU Note (Signed)
Patient states she had a ruptured ectopic in December 2014. States she has [facility] a [facility]. Is having abdominal pain.

## 2014-04-05 NOTE — MAU Provider Note (Signed)
Attestation of Attending Supervision of Advanced Practitioner (PA/CNM/NP): Evaluation and management procedures were performed by the Advanced Practitioner under my supervision and collaboration.  I have reviewed the Advanced Practitioner's note and chart, and I agree with the management and plan.  Keamber Macfadden, MD, FACOG Attending Obstetrician & Gynecologist Faculty Practice, [facility]   

## 2014-04-06 LAB — GC/CHLAMYDIA PROBE AMP
CT Probe RNA: NEGATIVE
GC Probe RNA: NEGATIVE

## 2014-04-07 LAB — OB RESULTS CONSOLE ANTIBODY SCREEN: Antibody Screen: NEGATIVE

## 2014-04-07 LAB — OB RESULTS CONSOLE HEPATITIS B SURFACE ANTIGEN: Hepatitis B Surface Ag: NEGATIVE

## 2014-04-07 LAB — OB RESULTS CONSOLE HIV ANTIBODY (ROUTINE TESTING): HIV: NONREACTIVE

## 2014-04-07 LAB — OB RESULTS CONSOLE RUBELLA ANTIBODY, IGM: Rubella: IMMUNE

## 2014-04-07 LAB — OB RESULTS CONSOLE RPR: RPR: NONREACTIVE

## 2014-04-07 LAB — OB RESULTS CONSOLE ABO/RH: RH Type: POSITIVE

## 2014-04-18 ENCOUNTER — Encounter (HOSPITAL_COMMUNITY): Payer: Self-pay

## 2014-06-17 NOTE — L&D Delivery Note (Signed)
Delivery Note At 2:52 AM a viable female "[name]" was delivered via  (Presentation: OA restituting to LOA).  APGARS: pending; weight pending.   Placenta status: Spontaneous, intact.  Cord: 3 vessels with the following complications: Nuchal cord -- baby somersaulted through. Battledore insertion.  Cord pH: NA  Anesthesia: Epidural  Episiotomy: NA Lacerations: None Suture Repair: NA Est. Blood Loss (mL):    Mom to postpartum.  Baby to Couplet care / Skin to Skin.  Mom strongly desires BTL prior to discharge, however consent signed less than 30 days ago (on 11/02/14) -- will discuss w/ Attending of record regarding inpatient versus outpatient BTL.  Sherre Scarlet 11/29/2014, 3:18 AM

## 2014-09-11 ENCOUNTER — Encounter (HOSPITAL_COMMUNITY): Payer: Self-pay | Admitting: *Deleted

## 2014-09-11 ENCOUNTER — Inpatient Hospital Stay (HOSPITAL_COMMUNITY)
Admission: AD | Admit: 2014-09-11 | Discharge: 2014-09-11 | Disposition: A | Discharge status: Home / Self Care | Payer: Medicaid Other | Source: Ambulatory Visit | Attending: Obstetrics and Gynecology | Admitting: Obstetrics and Gynecology

## 2014-09-11 DIAGNOSIS — Z882 Allergy status to sulfonamides status: Secondary | ICD-10-CM | POA: Diagnosis not present

## 2014-09-11 DIAGNOSIS — Z3A29 29 weeks gestation of pregnancy: Secondary | ICD-10-CM | POA: Insufficient documentation

## 2014-09-11 DIAGNOSIS — R3915 Urgency of urination: Secondary | ICD-10-CM | POA: Diagnosis present

## 2014-09-11 DIAGNOSIS — Z87891 Personal history of nicotine dependence: Secondary | ICD-10-CM | POA: Diagnosis not present

## 2014-09-11 DIAGNOSIS — R399 Unspecified symptoms and signs involving the genitourinary system: Secondary | ICD-10-CM

## 2014-09-11 DIAGNOSIS — O2343 Unspecified infection of urinary tract in pregnancy, third trimester: Secondary | ICD-10-CM | POA: Insufficient documentation

## 2014-09-11 LAB — URINALYSIS, ROUTINE W REFLEX MICROSCOPIC
Bilirubin Urine: NEGATIVE
GLUCOSE, UA: NEGATIVE mg/dL
KETONES UR: NEGATIVE mg/dL
Nitrite: NEGATIVE
PH: 5 (ref 5.0–8.0)
PROTEIN: NEGATIVE mg/dL
Specific Gravity, Urine: <1.005 — ABNORMAL LOW (ref 1.005–1.030)
Urobilinogen, UA: 0.2 mg/dL (ref 0.0–1.0)

## 2014-09-11 LAB — URINE MICROSCOPIC-ADD ON

## 2014-09-11 MED ORDER — NITROFURANTOIN MONOHYD MACRO 100 MG PO CAPS: 100.0000 mg | ORAL_CAPSULE | Freq: Two times a day (BID) | ORAL | Status: DC

## 2014-09-11 MED ORDER — CLOTRIMAZOLE 2 % VA CREA: 1.0000 | TOPICAL_CREAM | Freq: Every day | VAGINAL | Status: AC

## 2014-09-11 NOTE — MAU Note (Signed)
Pt c/o feeling like she has a UTI. Pain upon urinating since this morning, feels like she has to pee "all the time" and pelvic pressure for about a week. Denies fever, ctx, LOF or VB. +FM.

## 2014-09-11 NOTE — MAU Note (Signed)
Pt reports she thinks she has a bladder infection. States she has urgency and some burning with urination.

## 2014-09-11 NOTE — Discharge Instructions (Signed)
Pregnancy and Urinary Tract Infection °A urinary tract infection (UTI) is a bacterial infection of the urinary tract. Infection of the urinary tract can include the ureters, kidneys (pyelonephritis), bladder (cystitis), and urethra (urethritis). All pregnant women should be screened for bacteria in the urinary tract. Identifying and treating a UTI will decrease the risk of preterm labor and developing more serious infections in both the mother and baby. °CAUSES °Bacteria germs cause almost all UTIs.  °RISK FACTORS °Many factors can increase your chances of getting a UTI during pregnancy. These include: °· Having a short urethra. °· Poor toilet and hygiene habits. °· Sexual intercourse. °· Blockage of urine along the urinary tract. °· Problems with the pelvic muscles or nerves. °· Diabetes. °· Obesity. °· Bladder problems after having several children. °· Previous history of UTI. °SIGNS AND SYMPTOMS  °· Pain, burning, or a stinging feeling when urinating. °· Suddenly feeling the need to urinate right away (urgency). °· Loss of bladder control (urinary incontinence). °· Frequent urination, more than is common with pregnancy. °· Lower abdominal or back discomfort. °· Cloudy urine. °· Blood in the urine (hematuria). °· Fever.  °When the kidneys are infected, the symptoms may be: °· Back pain. °· Flank pain on the right side more so than the left. °· Fever. °· Chills. °· Nausea. °· Vomiting. °DIAGNOSIS  °A urinary tract infection is usually diagnosed through urine tests. Additional tests and procedures are sometimes done. These may include: °· Ultrasound exam of the kidneys, ureters, bladder, and urethra. °· Looking in the bladder with a lighted tube (cystoscopy). °TREATMENT °Typically, UTIs can be treated with antibiotic medicines.  °HOME CARE INSTRUCTIONS  °· Only take over-the-counter or prescription medicines as directed by your health care provider. If you were prescribed antibiotics, take them as directed. Finish  them even if you start to feel better. °· Drink enough fluids to keep your urine clear or pale yellow. °· Do not have sexual intercourse until the infection is gone and your health care provider says it is okay. °· Make sure you are tested for UTIs throughout your pregnancy. These infections often come back.  °Preventing a UTI in the Future °· Practice good toilet habits. Always wipe from front to back. Use the tissue only once. °· Do not hold your urine. Empty your bladder as soon as possible when the urge comes. °· Do not douche or use deodorant sprays. °· Wash with soap and warm water around the genital area and the anus. °· Empty your bladder before and after sexual intercourse. °· Wear underwear with a cotton crotch. °· Avoid caffeine and carbonated drinks. They can irritate the bladder. °· Drink cranberry juice or take cranberry pills. This may decrease the risk of getting a UTI. °· Do not drink alcohol. °· Keep all your appointments and tests as scheduled.  °SEEK MEDICAL CARE IF:  °· Your symptoms get worse. °· You are still having fevers 2 or more days after treatment begins. °· You have a rash. °· You feel that you are having problems with medicines prescribed. °· You have abnormal vaginal discharge. °SEEK IMMEDIATE MEDICAL CARE IF:  °· You have back or flank pain. °· You have chills. °· You have blood in your urine. °· You have nausea and vomiting. °· You have contractions of your uterus. °· You have a gush of fluid from the vagina. °MAKE SURE YOU: °· Understand these instructions.   °· Will watch your condition.   °· Will get help right away if you are not doing   well or get worse.   °Document Released: 09/28/2010 Document Revised: 03/24/2013 Document Reviewed: 12/31/2012 °ExitCare® Patient Information ©2015 [employer]. This information is not intended to replace advice given to you by your health care provider. Make sure you discuss any questions you have with your health care provider. ° °
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
## 2014-09-12 DIAGNOSIS — R399 Unspecified symptoms and signs involving the genitourinary system: Secondary | ICD-10-CM

## 2014-09-14 LAB — CULTURE, OB URINE

## 2014-10-27 LAB — OB RESULTS CONSOLE GBS: GBS: POSITIVE

## 2014-11-28 ENCOUNTER — Encounter (HOSPITAL_COMMUNITY): Payer: Self-pay | Admitting: *Deleted

## 2014-11-28 ENCOUNTER — Telehealth (HOSPITAL_COMMUNITY): Payer: Self-pay | Admitting: *Deleted

## 2014-11-28 ENCOUNTER — Inpatient Hospital Stay (HOSPITAL_COMMUNITY)
Admission: AD | Admit: 2014-11-28 | Discharge: 2014-11-30 | DRG: 775 | Disposition: A | Discharge status: Home / Self Care | Payer: Medicaid Other | Source: Ambulatory Visit | Attending: Obstetrics and Gynecology | Admitting: Obstetrics and Gynecology

## 2014-11-28 ENCOUNTER — Inpatient Hospital Stay (HOSPITAL_COMMUNITY): Payer: Medicaid Other | Admitting: Anesthesiology

## 2014-11-28 ENCOUNTER — Encounter (HOSPITAL_COMMUNITY): Payer: Self-pay

## 2014-11-28 DIAGNOSIS — F4310 Post-traumatic stress disorder, unspecified: Secondary | ICD-10-CM | POA: Diagnosis present

## 2014-11-28 DIAGNOSIS — F419 Anxiety disorder, unspecified: Secondary | ICD-10-CM | POA: Diagnosis present

## 2014-11-28 DIAGNOSIS — O99344 Other mental disorders complicating childbirth: Secondary | ICD-10-CM | POA: Diagnosis present

## 2014-11-28 DIAGNOSIS — Z881 Allergy status to other antibiotic agents status: Secondary | ICD-10-CM

## 2014-11-28 DIAGNOSIS — Z882 Allergy status to sulfonamides status: Secondary | ICD-10-CM

## 2014-11-28 DIAGNOSIS — Z3A40 40 weeks gestation of pregnancy: Secondary | ICD-10-CM | POA: Diagnosis present

## 2014-11-28 DIAGNOSIS — L309 Dermatitis, unspecified: Secondary | ICD-10-CM

## 2014-11-28 DIAGNOSIS — Z6833 Body mass index (BMI) 33.0-33.9, adult: Secondary | ICD-10-CM

## 2014-11-28 DIAGNOSIS — O99824 Streptococcus B carrier state complicating childbirth: Principal | ICD-10-CM | POA: Diagnosis present

## 2014-11-28 DIAGNOSIS — Z9049 Acquired absence of other specified parts of digestive tract: Secondary | ICD-10-CM

## 2014-11-28 DIAGNOSIS — F319 Bipolar disorder, unspecified: Secondary | ICD-10-CM | POA: Diagnosis present

## 2014-11-28 DIAGNOSIS — O99820 Streptococcus B carrier state complicating pregnancy: Secondary | ICD-10-CM

## 2014-11-28 DIAGNOSIS — IMO0002 Reserved for concepts with insufficient information to code with codable children: Secondary | ICD-10-CM

## 2014-11-28 LAB — CBC
HCT: 34 % — ABNORMAL LOW (ref 36.0–46.0)
HEMOGLOBIN: 11.9 g/dL — AB (ref 12.0–15.0)
MCH: 30.4 pg (ref 26.0–34.0)
MCHC: 35 g/dL (ref 30.0–36.0)
MCV: 86.7 fL (ref 78.0–100.0)
PLATELETS: 209 10*3/uL (ref 150–400)
RBC: 3.92 MIL/uL (ref 3.87–5.11)
RDW: 13.7 % (ref 11.5–15.5)
WBC: 17 10*3/uL — AB (ref 4.0–10.5)

## 2014-11-28 MED ORDER — FLEET ENEMA 7-19 GM/118ML RE ENEM: 1.0000 | ENEMA | RECTAL | Status: DC | PRN

## 2014-11-28 MED ORDER — ACETAMINOPHEN 325 MG PO TABS: 650.0000 mg | ORAL_TABLET | ORAL | Status: DC | PRN

## 2014-11-28 MED ORDER — FENTANYL 2.5 MCG/ML BUPIVACAINE 1/10 % EPIDURAL INFUSION (WH - ANES): 14.0000 mL/h | INTRAMUSCULAR | Status: DC | PRN

## 2014-11-28 MED ORDER — EPHEDRINE 5 MG/ML INJ
10.0000 mg | INTRAVENOUS | Status: DC | PRN
  Filled 2014-11-28: qty 2

## 2014-11-28 MED ORDER — LACTATED RINGERS IV SOLN: 500.0000 mL | INTRAVENOUS | Status: DC | PRN

## 2014-11-28 MED ORDER — OXYTOCIN 40 UNITS IN LACTATED RINGERS INFUSION - SIMPLE MED
INTRAVENOUS | Status: AC
  Filled 2014-11-28: qty 1000

## 2014-11-28 MED ORDER — AMPICILLIN SODIUM 2 G IJ SOLR
2.0000 g | Freq: Once | INTRAMUSCULAR | Status: AC
  Administered 2014-11-28: 2 g via INTRAVENOUS
  Filled 2014-11-28: qty 2000

## 2014-11-28 MED ORDER — HYDROXYZINE HCL 50 MG PO TABS
50.0000 mg | ORAL_TABLET | Freq: Four times a day (QID) | ORAL | Status: DC | PRN
  Filled 2014-11-28: qty 1

## 2014-11-28 MED ORDER — ONDANSETRON HCL 4 MG/2ML IJ SOLN: 4.0000 mg | Freq: Four times a day (QID) | INTRAMUSCULAR | Status: DC | PRN

## 2014-11-28 MED ORDER — LACTATED RINGERS IV SOLN
INTRAVENOUS | Status: DC
  Administered 2014-11-29: via INTRAVENOUS

## 2014-11-28 MED ORDER — PHENYLEPHRINE 40 MCG/ML (10ML) SYRINGE FOR IV PUSH (FOR BLOOD PRESSURE SUPPORT): 80.0000 ug | PREFILLED_SYRINGE | INTRAVENOUS | Status: DC | PRN

## 2014-11-28 MED ORDER — OXYCODONE-ACETAMINOPHEN 5-325 MG PO TABS: 1.0000 | ORAL_TABLET | ORAL | Status: DC | PRN

## 2014-11-28 MED ORDER — DIPHENHYDRAMINE HCL 50 MG/ML IJ SOLN: 12.5000 mg | INTRAMUSCULAR | Status: DC | PRN

## 2014-11-28 MED ORDER — CITRIC ACID-SODIUM CITRATE 334-500 MG/5ML PO SOLN: 30.0000 mL | ORAL | Status: DC | PRN

## 2014-11-28 MED ORDER — LIDOCAINE HCL (PF) 1 % IJ SOLN
30.0000 mL | INTRAMUSCULAR | Status: DC | PRN
  Filled 2014-11-28: qty 30

## 2014-11-28 MED ORDER — NALBUPHINE HCL 10 MG/ML IJ SOLN
10.0000 mg | INTRAMUSCULAR | Status: DC | PRN
  Administered 2014-11-28: 10 mg via INTRAVENOUS
  Filled 2014-11-28: qty 1

## 2014-11-28 MED ORDER — PHENYLEPHRINE 40 MCG/ML (10ML) SYRINGE FOR IV PUSH (FOR BLOOD PRESSURE SUPPORT)
80.0000 ug | PREFILLED_SYRINGE | INTRAVENOUS | Status: DC | PRN
  Filled 2014-11-28: qty 20
  Filled 2014-11-28: qty 2

## 2014-11-28 MED ORDER — OXYTOCIN BOLUS FROM INFUSION
500.0000 mL | INTRAVENOUS | Status: DC
  Administered 2014-11-29: 500 mL via INTRAVENOUS

## 2014-11-28 MED ORDER — OXYCODONE-ACETAMINOPHEN 5-325 MG PO TABS: 2.0000 | ORAL_TABLET | ORAL | Status: DC | PRN

## 2014-11-28 MED ORDER — OXYTOCIN 40 UNITS IN LACTATED RINGERS INFUSION - SIMPLE MED: 62.5000 mL/h | INTRAVENOUS | Status: DC

## 2014-11-28 MED ORDER — FENTANYL 2.5 MCG/ML BUPIVACAINE 1/10 % EPIDURAL INFUSION (WH - ANES)
14.0000 mL/h | INTRAMUSCULAR | Status: DC | PRN
  Administered 2014-11-28: 14 mL/h via EPIDURAL
  Filled 2014-11-28: qty 125

## 2014-11-28 MED ORDER — LIDOCAINE HCL (PF) 1 % IJ SOLN
INTRAMUSCULAR | Status: AC
  Administered 2014-11-28 (×2): 4 mL
  Filled 2014-11-28: qty 30

## 2014-11-28 NOTE — Telephone Encounter (Signed)
Preadmission screen  

## 2014-11-28 NOTE — MAU Note (Signed)
Contractions tonight with bloody show. Denies LOF. Was 3cm in office today. GBS positive.

## 2014-11-28 NOTE — Anesthesia Preprocedure Evaluation (Signed)
Anesthesia Evaluation    Airway Mallampati: III  TM Distance: >3 FB Neck ROM: Full    Dental no notable dental hx. (+) Teeth Intact   Pulmonary neg pulmonary ROS, former smoker,  breath sounds clear to auscultation  Pulmonary exam normal       Cardiovascular negative cardio ROS Normal cardiovascular examRhythm:Regular Rate:Normal     Neuro/Psych  Headaches, PSYCHIATRIC DISORDERS Anxiety Bipolar Disorder PTSD   GI/Hepatic negative GI ROS, Neg liver ROS,   Endo/Other  Obesity  Renal/GU negative Renal ROS  negative genitourinary   Musculoskeletal Eczema   Abdominal (+) + obese,   Peds  Hematology  (+) anemia ,   Anesthesia Other Findings   Reproductive/Obstetrics (+) Pregnancy                             Anesthesia Physical Anesthesia Plan  ASA: II  Anesthesia Plan: Epidural   Post-op Pain Management:    Induction:   Airway Management Planned: Natural Airway  Additional Equipment:   Intra-op Plan:   Post-operative Plan:   Informed Consent: I have reviewed the patients History and Physical, chart, labs and discussed the procedure including the risks, benefits and alternatives for the proposed anesthesia with the patient or authorized representative who has indicated his/her understanding and acceptance.     Plan Discussed with: Anesthesiologist  Anesthesia Plan Comments:         Anesthesia Quick Evaluation
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
## 2014-11-28 NOTE — Anesthesia Procedure Notes (Signed)
Epidural Patient location during procedure: OB Start time: 11/28/2014 11:41 PM  Staffing Anesthesiologist: Mal Amabile Performed by: anesthesiologist   Preanesthetic Checklist Completed: patient identified, site marked, surgical consent, pre-op evaluation, timeout performed, IV checked, risks and benefits discussed and monitors and equipment checked  Epidural Patient position: sitting Prep: site prepped and draped and DuraPrep Patient monitoring: continuous pulse ox and blood pressure Approach: midline Location: L3-L4 Injection technique: LOR air  Needle:  Needle type: Tuohy  Needle gauge: 17 G Needle length: 9 cm and 9 Needle insertion depth: 6 cm Catheter type: closed end flexible Catheter size: 19 Gauge Catheter at skin depth: 11 cm Test dose: negative and Other  Assessment Events: blood not aspirated, injection not painful, no injection resistance, negative IV test and no paresthesia  Additional Notes Patient identified. Risks and benefits discussed including failed block, incomplete  Pain control, post dural puncture headache, nerve damage, paralysis, blood pressure Changes, nausea, vomiting, reactions to medications-both toxic and allergic and post Partum back pain. All questions were answered. Patient expressed understanding and wished to proceed. Sterile technique was used throughout procedure. Epidural site was Dressed with sterile barrier dressing. No paresthesias, signs of intravascular injection Or signs of intrathecal spread were encountered.  Patient was more comfortable after the epidural was dosed. Please see RN's note for documentation of vital signs and FHR which are stable.

## 2014-11-29 ENCOUNTER — Encounter (HOSPITAL_COMMUNITY): Payer: Self-pay | Admitting: Obstetrics

## 2014-11-29 LAB — TYPE AND SCREEN
ABO/RH(D): A POS
ANTIBODY SCREEN: NEGATIVE

## 2014-11-29 LAB — CBC
HEMATOCRIT: 30.5 % — AB (ref 36.0–46.0)
Hemoglobin: 10.4 g/dL — ABNORMAL LOW (ref 12.0–15.0)
MCH: 30.2 pg (ref 26.0–34.0)
MCHC: 34.1 g/dL (ref 30.0–36.0)
MCV: 88.7 fL (ref 78.0–100.0)
Platelets: 175 10*3/uL (ref 150–400)
RBC: 3.44 MIL/uL — ABNORMAL LOW (ref 3.87–5.11)
RDW: 13.7 % (ref 11.5–15.5)
WBC: 21.9 10*3/uL — ABNORMAL HIGH (ref 4.0–10.5)

## 2014-11-29 LAB — RPR: RPR Ser Ql: NONREACTIVE

## 2014-11-29 MED ORDER — BENZOCAINE-MENTHOL 20-0.5 % EX AERO: 1.0000 "application " | INHALATION_SPRAY | CUTANEOUS | Status: DC | PRN

## 2014-11-29 MED ORDER — OXYCODONE-ACETAMINOPHEN 5-325 MG PO TABS
1.0000 | ORAL_TABLET | ORAL | Status: DC | PRN
  Administered 2014-11-29: 1 via ORAL
  Filled 2014-11-29: qty 1

## 2014-11-29 MED ORDER — ACETAMINOPHEN 325 MG PO TABS: 650.0000 mg | ORAL_TABLET | ORAL | Status: DC | PRN

## 2014-11-29 MED ORDER — DIBUCAINE 1 % RE OINT: 1.0000 "application " | TOPICAL_OINTMENT | RECTAL | Status: DC | PRN

## 2014-11-29 MED ORDER — DIPHENHYDRAMINE HCL 25 MG PO CAPS: 25.0000 mg | ORAL_CAPSULE | Freq: Four times a day (QID) | ORAL | Status: DC | PRN

## 2014-11-29 MED ORDER — TETANUS-DIPHTH-ACELL PERTUSSIS 5-2.5-18.5 LF-MCG/0.5 IM SUSP: 0.5000 mL | Freq: Once | INTRAMUSCULAR | Status: DC

## 2014-11-29 MED ORDER — SENNOSIDES-DOCUSATE SODIUM 8.6-50 MG PO TABS
2.0000 | ORAL_TABLET | ORAL | Status: DC
  Administered 2014-11-29: 2 via ORAL
  Filled 2014-11-29: qty 2

## 2014-11-29 MED ORDER — ONDANSETRON HCL 4 MG PO TABS: 4.0000 mg | ORAL_TABLET | ORAL | Status: DC | PRN

## 2014-11-29 MED ORDER — ONDANSETRON HCL 4 MG/2ML IJ SOLN
4.0000 mg | INTRAMUSCULAR | Status: DC | PRN
Start: 2014-11-29 — End: 2014-11-30

## 2014-11-29 MED ORDER — PRENATAL MULTIVITAMIN CH
1.0000 | ORAL_TABLET | Freq: Every day | ORAL | Status: DC
  Administered 2014-11-29 – 2014-11-30 (×2): 1 via ORAL
  Filled 2014-11-29 (×2): qty 1

## 2014-11-29 MED ORDER — ZOLPIDEM TARTRATE 5 MG PO TABS: 5.0000 mg | ORAL_TABLET | Freq: Every evening | ORAL | Status: DC | PRN

## 2014-11-29 MED ORDER — WITCH HAZEL-GLYCERIN EX PADS: 1.0000 "application " | MEDICATED_PAD | CUTANEOUS | Status: DC | PRN

## 2014-11-29 MED ORDER — SIMETHICONE 80 MG PO CHEW: 80.0000 mg | CHEWABLE_TABLET | ORAL | Status: DC | PRN

## 2014-11-29 MED ORDER — IBUPROFEN 600 MG PO TABS
600.0000 mg | ORAL_TABLET | Freq: Four times a day (QID) | ORAL | Status: DC
  Administered 2014-11-29 – 2014-11-30 (×6): 600 mg via ORAL
  Filled 2014-11-29 (×6): qty 1

## 2014-11-29 MED ORDER — LANOLIN HYDROUS EX OINT: TOPICAL_OINTMENT | CUTANEOUS | Status: DC | PRN

## 2014-11-29 MED ORDER — OXYCODONE-ACETAMINOPHEN 5-325 MG PO TABS: 2.0000 | ORAL_TABLET | ORAL | Status: DC | PRN

## 2014-11-29 NOTE — Lactation Note (Signed)
This note was copied from the chart of [name]. Lactation Consultation Note Mom attempted to BF her 1st child but stated it didn't go well because she didn't know what she was doing and gave up. The 2nd child who is now 2 yrs. Old she Bf for 2 months and did much better, but had low milk supply and stopped. Mom states her goal is to BF longer and do better. Has personal DEBP. RN gave pump kit. I asked if she knew how to set it up and clean, mom stated yes and didn't need assistance. Gave her soap and pan. Encouraged mom to post pump after BF to stimulate her breast. Has everted large raspberry nipples. States Teacher, adult education well w/o pain. Baby sleeping in FOB arms. Encouraged to BF STS. Breast concern me some d/t moms breast look slightly unilateral and connect. In center of chest breast doesn't have a deep dip, it's a slight curve and appears to have connective breast tissue w/other breast. Tissue in center of chest and upwards towards neck is jiggly. Wide space in this curve and nipple point posterior. I can feel glands near nipples w/colostrum expressed.  Mom encouraged to feed baby 8-12 times/24 hours and with feeding cues. Mom encouraged to waken baby for feeds. Referred to Baby and Me Book in Breastfeeding section Pg. 22-23 for position options and Proper latch demonstration. Educated about newborn behavior, sleeping and feeding patterns, the importance of supply and demand, and I&O. Referred to Baby and Me Book in Breastfeeding section Pg. 22-23 for position options and Proper latch demonstration.[place] brochure given w/resources, support groups and [facility] services. Patient Name: [name] [id number] Date: 11/29/2014 Reason for consult: Initial assessment   Maternal Data Has patient been taught Hand Expression?: Yes Does the patient have breastfeeding experience prior to this delivery?: Yes  Feeding Feeding Type: Breast Fed Length of feed: 30 min  LATCH Score/Interventions        Type of Nipple: Everted at rest and after stimulation  Comfort (Breast/Nipple): Soft / non-tender     Intervention(s): Breastfeeding basics reviewed;Support Pillows;Position options;Skin to skin     Lactation Tools Discussed/Used Tools: Pump Breast pump type: Double-Electric Breast Pump Pump Review: Other (comment);Milk Storage (explained importance of cleaning, mom stated she knew how) Initiated by:: Allayne Stack RN Date initiated:: 11/29/14   Consult Status Consult Status: Follow-up Date: 11/30/14 Follow-up type: In-patient    Theodoro Kalata 11/29/2014, 7:09 PM

## 2014-11-29 NOTE — Progress Notes (Signed)
  Subjective: Comfortable w/ epidural. Friend at bedside.  Objective: BP 131/84 mmHg  Pulse 87  Temp(Src) 98.6 F (37 C) (Oral)  Resp 20  Ht 5\' 8"  (1.727 m)  Wt 98.884 kg (218 lb)  BMI 33.15 kg/m2  SpO2 97%  LMP 02/16/2014     Today's Vitals   11/29/14 0023 11/29/14 0024 11/29/14 0029 11/29/14 0059  BP:  143/85 127/77 131/84  Pulse: 93 95 95 87  Temp:      TempSrc:      Resp:  18 20 20   Height:      Weight:      SpO2: 97%     PainSc:       FHT: BL 140 w/ moderate variability, +accels, no decels UC:   irregular, every 2-3 minutes SVE:   Dilation: 5 Effacement (%): 90 Station: -1 Exam by::  (Dr. Caryl Never) 1st dose Ampicillin given at 22:46 PM AROM, moderate amount of clear fluid Cephalic by exam  Assessment:  IUP at term Active labor GBS positive  Plan: Continue current plan Expect progress and SVD Consult prn  Sherre Scarlet CNM 11/29/2014, 1:26 AM

## 2014-11-29 NOTE — Progress Notes (Signed)
[employer]  Post Partum Day 0: 7HRS Postpartum:S/P SVD  Subjective: Patient up ad lib, denies syncope or dizziness. Reports consuming regular diet without issues and denies N/V. Denies issues with urination and reports bleeding is "small."  Patient is breastfeeding.  Desires BTL for postpartum contraception and expresses frustration about not being able to obtain BTL while hospitalized.  Pain is being appropriately managed with use of ibuprofen.  Objective: Filed Vitals:   11/29/14 0414 11/29/14 0500 11/29/14 0600 11/29/14 1030  BP: 124/76 119/75 120/72 131/91  Pulse: 87 77 88 76  Temp:  98.5 F (36.9 C) 97.8 F (36.6 C) 98 F (36.7 C)  TempSrc:  Oral Oral Oral  Resp: 18 20 18    Height:      Weight:      SpO2:  98% 98% 99%    Recent Labs  11/28/14 2230 11/29/14 0643  HGB 11.9* 10.4*  HCT 34.0* 30.5*    Physical Exam:  General: alert, cooperative and no distress Mood/Affect: Appropriate Lungs: clear to auscultation, no wheezes, rales or rhonchi, symmetric air entry.  Heart: normal rate and regular rhythm. Breast: not examined. Abdomen:  + bowel sounds, Soft NT Uterine Fundus: firm, +1/U Lochia: appropriate Laceration: None Skin: Warm Dry. DVT Evaluation: Not assessed  Assessment S/P Vaginal Delivery-Day 0 7HRS Post Delivery Appropriate [facility] BreastFeeding  Plan: Reassurances given regarding BTL procedure and process in PPP Patient verbalized understanding  Continue current care as ordered Dr. Kathie Rhodes. [place] to be updated on patient status   Roda Lauture, Joyice Faster, MSN, CNM 11/29/2014, 11:46 AM

## 2014-11-29 NOTE — Progress Notes (Signed)
  Subjective: Comfortable w/ epidural, but called out with urge to push. Family at bedside.  Objective: BP 124/73 mmHg  Pulse 79  Temp(Src) 98.6 F (37 C) (Oral)  Resp 18  Ht 5\' 8"  (1.727 m)  Wt 98.884 kg (218 lb)  BMI 33.15 kg/m2  SpO2 97%  LMP 02/16/2014     Today's Vitals   11/29/14 0059 11/29/14 0130 11/29/14 0200 11/29/14 0230  BP: 131/84 133/85 124/73 144/79  Pulse: 87 77 79 83  Temp:      TempSrc:      Resp: 20 20 18 18   Height:      Weight:      SpO2:      PainSc:       FHT: BL 125 w/ mod variability, +accels, variables UC:   irregular, every 2-3 minutes SVE: C/C/+2     Assessment:  IUP at term 2nd stage labor GBS positive  Plan: Commence w/ pushing Expect SVD briefly  Sherre Scarlet CNM 11/29/2014, 2:34 AM

## 2014-11-29 NOTE — Anesthesia Postprocedure Evaluation (Signed)
  Anesthesia Post-op Note  Patient: [name]  Procedure(s) Performed: * No procedures listed *  Patient Location: Mother/Baby  Anesthesia Type:Epidural  Level of Consciousness: awake, alert  and oriented  Airway and Oxygen Therapy: Patient Spontanous Breathing  Post-op Pain: mild  Post-op Assessment: Patient's Cardiovascular Status Stable, Respiratory Function Stable, Patent Airway, No signs of Nausea or vomiting, Pain level controlled, No headache, No backache and Patient able to bend at knees              Post-op Vital Signs: Reviewed and stable  Last Vitals:  Filed Vitals:   11/29/14 1030  BP: 131/91  Pulse: 76  Temp: 36.7 C  Resp:     Complications: No apparent anesthesia complications

## 2014-11-30 ENCOUNTER — Inpatient Hospital Stay (HOSPITAL_COMMUNITY): Admission: RE | Admit: 2014-11-30 | Payer: Medicaid Other | Source: Ambulatory Visit

## 2014-11-30 MED ORDER — IBUPROFEN 600 MG PO TABS: 600.0000 mg | ORAL_TABLET | Freq: Four times a day (QID) | ORAL | Status: DC | PRN

## 2014-11-30 MED ORDER — MEDROXYPROGESTERONE ACETATE 150 MG/ML IM SUSP
150.0000 mg | Freq: Once | INTRAMUSCULAR | Status: AC
  Administered 2014-11-30: 150 mg via INTRAMUSCULAR
  Filled 2014-11-30: qty 1

## 2014-11-30 MED ORDER — OXYCODONE-ACETAMINOPHEN 5-325 MG PO TABS: 1.0000 | ORAL_TABLET | ORAL | Status: DC | PRN

## 2014-11-30 NOTE — Progress Notes (Addendum)
Subjective: Postpartum Day 1: Vaginal delivery, no laceration Patient up ad lib, reports no syncope or dizziness. Feeding:  Breast Contraceptive plan:  Depo, then BTL ASAP--BTL consent signed 11/02/14.  Desired d/c today, but only received single dose of Ampicillin for GBS prophylaxis prior to delivery.  Advised patient peds likely would not support d/c today.  Objective: Vital signs in last 24 hours: Temp:  [97.5 F (36.4 C)-98.2 F (36.8 C)] 98.2 F (36.8 C) (06/15 0648) Pulse Rate:  [58-76] 59 (06/15 0648) Resp:  [16-18] 16 (06/15 0648) BP: (126-137)/(74-91) 126/78 mmHg (06/15 0648) SpO2:  [97 %-99 %] 97 % (06/15 0204)  Physical Exam:  General: alert Lochia: appropriate Uterine Fundus: firm Perineum: Intact DVT Evaluation: No evidence of DVT seen on physical exam. Negative Homan's sign.   CBC Latest Ref Rng 11/29/2014 11/28/2014 04/05/2014  WBC 4.0 - 10.5 K/uL 21.9(H) 17.0(H) 8.2  Hemoglobin 12.0 - 15.0 g/dL 10.4(L) 11.9(L) 13.1  Hematocrit 36.0 - 46.0 % 30.5(L) 34.0(L) 38.2  Platelets 150 - 400 K/uL 175 209 260     Assessment/Plan: Status post vaginal delivery day 1 Mild leukocytosis without fever Desires pp BTL. Stable Repeat CBC tomorrow. Continue current care. Depo tomorrow prior to d/c. Will have office contact patient to discuss BTL prior to 6 weeks. Plan for discharge tomorrow    Nyra Capes 11/30/2014, 9:47 AM

## 2014-11-30 NOTE — Discharge Instructions (Signed)

## 2014-11-30 NOTE — Progress Notes (Signed)
CLINICAL SOCIAL WORK MATERNAL/CHILD NOTE  Patient Details  Name: [name] MRN: [id number] Date of Birth: 11/29/2012  Date:  11/30/2014  Clinical Social Worker Initiating Note:  Loleta Books, LCSW Date/ Time Initiated:  11/30/14/0930     Child's Name:  [name]   Legal Guardian:  Andres Shad and Clifton Custard (parents)  Need for Interpreter:  None   Date of Referral:  11/29/14     Reason for Referral:  Behavioral Health Issues- history of bipolar, PTSD, and anxiety  Referral Source:  [facility]   Address:  [address] [place], [place] [address]  Phone number:  [phone]   Household Members:  Significant Other, Minor Children   Natural Supports (not living in the home):  Immediate Family   Professional Supports: None   Employment:   Did not assess  Type of Work:   N/A  Education:    N/A  Architect:  Medicaid   Other Resources:  Sales executive , [employer]   Cultural/Religious Considerations Which May Impact Care:  None reported  Strengths:  Ability to meet basic needs , Home prepared for child , Pediatrician chosen    Risk Factors/Current Problems:  Mental Health Concerns    Cognitive State:  Able to Concentrate , Alert , Goal Oriented , Linear Thinking    Mood/Affect:  Happy , Bright , Comfortable , Calm    CSW Assessment:  CSW received referral due to maternal history of bipolar, anxiety, and PTSD.  [name] presented in a pleasant mood and displayed an appropriate range in affect. She was holding and attending to the infant during the visit. [name] was not forthcoming with information regarding her mental health history. No acute mental health symptoms observed or noted in her thought process.  [name] discussed feeling ready and excited to be discharged home. [name] denied questions, concerns, or needs as she prepares to transition to the postpartum period. She endorsed having a strong and supportive family, including her brother and her mother.   [name] endorsed being prepared at home.    [name] acknowledged diagnoses of bipolar, PTSD, and anxiety that are documented in her chart. She stated that she was diagnosed 4 years ago, but was unable to recall who gave her the diagnosis. [name] shared that she no longer believes PTSD is an accurate diagnosis (but was at the time of diagnosis), and also reported belief that bipolar is not an accurate diagnosis. [name] was unable to recall events or symptoms that led to the diagnosis. Per [name], she does believe that the diagnosis of anxiety is accurate.  She stated that she has a history of participating in therapy and medication which has assisted her to gain insight on her triggers and increase self-awareness.  [name] did not clarify her triggers, but she shared belief that she has a strong sense of awareness.  She denied history of postpartum depression/anxiety, but acknowledged that she presents with an increased risk due to her mental health history.  [name] expressed goal of seeking treatment for her anxiety once she is no longer breastfeeding, but declined referral information at this time.  CSW attempted to explore with [name] potential signs that her anxiety is worsening or may need to seek out treatment sooner than planned, but [name] shared that she will "know" due to her self-awareness.  [name] agreed to contact her medical provider if she notes worsening symptoms.  [name] denied additional questions, concerns, or needs at this time. She agreed to contact CSW if needs arise.  CSW  Plan/Description:   1)Patient/Family Education: Perinatal mood and anxiety disorders 2)Information/Referral to [facility]: [name] reported intention to seek out treatment once she is no longer breastfeeding. She declined offer for referral and expressed belief that her support system will assist her when needed. 3)No Further Intervention Required/No Barriers to Discharge    Kelby Fam 11/30/2014, 9:50 AM

## 2014-11-30 NOTE — Discharge Summary (Signed)
  Vaginal Delivery Discharge Summary  [name]  DOB:    1980-09-21 MRN:    [id number] CSN:    [id number]  Date of admission:                  11/28/14  Date of discharge:                   11/30/14  Procedures this admission:   SVB  Date of Delivery: 11/29/14  Newborn Data:  Live born female  Birth Weight: 8 lb 6.2 oz (3805 g) APGAR: 8, 9  Home with mother. Name: [name]   History of Present Illness:  [name] is a 34 y.o. female, [id number], who presents at [redacted]w[redacted]d weeks gestation. The patient has been followed at [facility]. She was admitted for onset of labor. Her pregnancy has been complicated by:  Patient Active Problem List   Diagnosis Date Noted  . Vaginal delivery 11/29/2014  . Urinary tract infection symptoms 09/12/2014  . Ectopic pregnancy, tubal 06/05/2013  . GBS (group B Streptococcus carrier), +RV culture, currently pregnant 06/04/2012  . Bipolar 2 disorder 11/13/2011  . PTSD (post-traumatic stress disorder) 11/13/2011  . 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 Migraines 11/13/2011  . E-coli UTI 11/13/2011     Hospital Course:   Admitting Dx: IUP at 40 6/7 weeks, + GBS, active labor GBS Status:  Positive Delivering Clinician: Sherre Scarlet, CNM Lacerations/MLE: None Complications: None BTL consent signed 11/02/14--office will work to schedule BTL prior to 6 week visit.  Patient requested Depo prior to d/c. Desired d/c on pp day 1, but only received single dose of Ampicillin prior to delivery.  Intrapartum Procedures: spontaneous vaginal delivery Postpartum Procedures: none--Depo Provera given prior to d/c. Complications-Operative and Postpartum: none  Discharge Diagnoses: Term Pregnancy-delivered and GBS positive  Feeding:  breast  Contraception:  Depo-Provera now, then BTL  Hemoglobin Results:  CBC Latest Ref Rng 11/29/2014 11/28/2014  04/05/2014  WBC 4.0 - 10.5 K/uL 21.9(H) 17.0(H) 8.2  Hemoglobin 12.0 - 15.0 g/dL 10.4(L) 11.9(L) 13.1  Hematocrit 36.0 - 46.0 % 30.5(L) 34.0(L) 38.2  Platelets 150 - 400 K/uL 175 209 260    Discharge Physical Exam:   General: alert Lochia: appropriate Uterine Fundus: firm Incision: Intact DVT Evaluation: No evidence of DVT seen on physical exam. Negative Homan's sign.   Discharge Information:  Activity:           pelvic rest Diet:                routine Medications: Ibuprofen and Percocet, Depo Provera given at d/c. Condition:      stable Instructions:  Routine pp instructions   Discharge to: home  Follow-up Information    Follow up with [facility] In 5 weeks.   Specialty:  Obstetrics and Gynecology   Why:  Office will call you to try to schedule tubal ligation prior to your six week visit.   Contact information:   [address] [place] [address] [phone]       Nigel Bridgeman [facility] 11/30/2014 8:55 AM

## 2014-12-14 ENCOUNTER — Other Ambulatory Visit: Payer: Self-pay | Admitting: Obstetrics and Gynecology

## 2014-12-22 ENCOUNTER — Encounter (HOSPITAL_COMMUNITY): Payer: Self-pay

## 2014-12-22 ENCOUNTER — Encounter (HOSPITAL_COMMUNITY)
Admission: RE | Admit: 2014-12-22 | Discharge: 2014-12-22 | Disposition: A | Discharge status: Home / Self Care | Payer: Medicaid Other | Source: Ambulatory Visit | Attending: Obstetrics and Gynecology | Admitting: Obstetrics and Gynecology

## 2014-12-22 DIAGNOSIS — Z01818 Encounter for other preprocedural examination: Secondary | ICD-10-CM | POA: Insufficient documentation

## 2014-12-22 HISTORY — DX: Depression, unspecified: F32.A

## 2014-12-22 HISTORY — DX: Major depressive disorder, single episode, unspecified: F32.9

## 2014-12-22 LAB — CBC
HCT: 40 % (ref 36.0–46.0)
Hemoglobin: 13.3 g/dL (ref 12.0–15.0)
MCH: 30.5 pg (ref 26.0–34.0)
MCHC: 33.3 g/dL (ref 30.0–36.0)
MCV: 91.7 fL (ref 78.0–100.0)
Platelets: 274 10*3/uL (ref 150–400)
RBC: 4.36 MIL/uL (ref 3.87–5.11)
RDW: 12.9 % (ref 11.5–15.5)
WBC: 5.5 10*3/uL (ref 4.0–10.5)

## 2014-12-22 NOTE — Patient Instructions (Addendum)
   Your procedure is scheduled on:  Monday, July 18  Enter through the Main Entrance of [facility] at:  10:45 Pick up the phone at the desk and dial [phone] and inform us of your arrival.  Please call this number if you have any problems the morning of surgery: [phone]  Remember: Do not eat food after midnight: Sunday Do not drink clear liquids after: 8 AM Monday, day of surgery Take these medicines the morning of surgery with a SIP OF WATER:  None  Do not wear jewelry, make-up, or FINGER nail polish No metal in your hair or on your body. Do not wear lotions, powders, perfumes.  You may wear deodorant.  Do not bring valuables to the hospital..  Patients discharged on the day of surgery will not be allowed to drive home.  Home with mother [name] cell [phone]

## 2014-12-26 ENCOUNTER — Other Ambulatory Visit (HOSPITAL_COMMUNITY): Payer: Self-pay | Admitting: Obstetrics and Gynecology

## 2014-12-26 NOTE — H&P (Signed)
Mallory MediateLindsey D Smiles is a 34 y.o.  female P: 3-0-3-3 presents for tubal sterilization for permanent sterilization.  Patient is S/P vaginal delivery 11/29/2014 and is currently using Depo Provera.  She has used, in the past oral contraceptives and the Nexplanon implantable device for contraception.  The patient has decided that she no longer wants to bear children and desires  laparoscopic tubal sterilization since she has already had her right tube removed due to an ectopic in 2014.   Past Medical History  OB History: G:6;   P: 3-0-3-3;  SVB: 2010,  2013 and 2016    (ectopic 2014)   GYN History: menarche: [age];    LMP: post-partum/breastfeeding    Contracepton Depo-Provera  The patient reports a past history of: chlamydia and herpes.  Denies history of abnormal PAP smear.  Last PAP smear: 2014-normal  Medical History: Migraine, Anxiety, Bipolar Disorder, PTSD and Gallstones  Surgical History:  1996- Cosmetic Facial Surgery (due to dog bite); 2003-Right Hand/Wrist Ligament Surgery; 2010-Cholecystectomy and 2014-Right Salpingectomy for Ectopic  Denies problems with anesthesia or history of blood transfusions  Family History: Asthma, Diabetes Mellitus, Heart Disease, Hodgkin's Lymphoma, Arthritis, Migraine, Alcoholism, Stroke, Thyroid Disease and Ovarian Cancer d Social History:  Divorced  and a Consulting civil engineertudent;  Former Smoker and occasional alcohol use   Outpatient Encounter Prescriptions as of 12/26/2014  Medication Sig  . Prenatal Vit-Fe Fumarate-FA (PRENATAL MULTIVITAMIN) TABS tablet Take 1 tablet by mouth daily at 12 noon.   No facility-administered encounter medications on file as of 12/26/2014.    Allergies  Allergen Reactions  . Biaxin [Clarithromycin] Swelling  . Sulfa Antibiotics Swelling    Denies sensitivity to peanuts, shellfish, soy, latex or adhesives.  ROS: Admits to reading glasses but  denies headache, vision changes, nasal congestion, dysphagia, tinnitus, dizziness, hoarseness, cough,   chest pain, shortness of breath, nausea, vomiting, diarrhea,constipation,  urinary frequency, urgency  dysuria, hematuria, vaginitis symptoms, pelvic pain, swelling of joints,easy bruising,  myalgias, arthralgias, skin rashes, unexplained weight loss and except as is mentioned in the history of present illness, patient's review of systems is otherwise negative.   Physical Exam  Bp: 102/60    P: 84    R: 18     Temperature: 98.5 degrees F orally   Weight:  203 lbs.  Heght: 5\' 8"   BMI: 30.9  Neck: supple without masses or thyromegaly Lungs: clear to auscultation Heart: regular rate and rhythm Abdomen: soft, non-tender and no organomegaly Pelvic:EGBUS- wnl; vagina-normal rugae with scant brown discharge; uterus-upper limits of normal size, cervix without lesions or motion tenderness; adnexae-no tenderness or masses Extremities:  no clubbing, cyanosis or edema   Assesment: Desire for Sterilzation   Disposition:  A discussion was held with patient regarding the indication for her procedure(s) along with the risks, which include but are not limited to: reaction to anesthesia, damage to adjacent organs, infection,  excessive bleeding and a  2  in 500 risk of failure.  The patient verbalized understanding of these risks and has consented to proceed with Laparoscopic Tubal Sterilization on January 02, 2015.   CSN# [id number]   Sherran Margolis J. Lowell GuitarPowell, PA-C  for Dr. Crist FatSandra A. Rivard

## 2015-01-02 ENCOUNTER — Ambulatory Visit (HOSPITAL_COMMUNITY): Payer: Medicaid Other | Admitting: Certified Registered"

## 2015-01-02 ENCOUNTER — Encounter (HOSPITAL_COMMUNITY)
Admission: RE | Disposition: A | Discharge status: Home / Self Care | Payer: Self-pay | Source: Ambulatory Visit | Attending: Obstetrics and Gynecology

## 2015-01-02 ENCOUNTER — Encounter (HOSPITAL_COMMUNITY): Payer: Self-pay | Admitting: Emergency Medicine

## 2015-01-02 ENCOUNTER — Ambulatory Visit (HOSPITAL_COMMUNITY)
Admission: RE | Admit: 2015-01-02 | Discharge: 2015-01-02 | Disposition: A | Discharge status: Home / Self Care | Payer: Medicaid Other | Source: Ambulatory Visit | Attending: Obstetrics and Gynecology | Admitting: Obstetrics and Gynecology

## 2015-01-02 DIAGNOSIS — Z882 Allergy status to sulfonamides status: Secondary | ICD-10-CM | POA: Insufficient documentation

## 2015-01-02 DIAGNOSIS — Z9079 Acquired absence of other genital organ(s): Secondary | ICD-10-CM | POA: Insufficient documentation

## 2015-01-02 DIAGNOSIS — O99215 Obesity complicating the puerperium: Secondary | ICD-10-CM | POA: Insufficient documentation

## 2015-01-02 DIAGNOSIS — Z6833 Body mass index (BMI) 33.0-33.9, adult: Secondary | ICD-10-CM | POA: Diagnosis not present

## 2015-01-02 DIAGNOSIS — O9081 Anemia of the puerperium: Secondary | ICD-10-CM | POA: Diagnosis not present

## 2015-01-02 DIAGNOSIS — G43909 Migraine, unspecified, not intractable, without status migrainosus: Secondary | ICD-10-CM | POA: Diagnosis not present

## 2015-01-02 DIAGNOSIS — O99355 Diseases of the nervous system complicating the puerperium: Secondary | ICD-10-CM | POA: Diagnosis not present

## 2015-01-02 DIAGNOSIS — Z87891 Personal history of nicotine dependence: Secondary | ICD-10-CM | POA: Insufficient documentation

## 2015-01-02 DIAGNOSIS — Z9049 Acquired absence of other specified parts of digestive tract: Secondary | ICD-10-CM | POA: Insufficient documentation

## 2015-01-02 DIAGNOSIS — E669 Obesity, unspecified: Secondary | ICD-10-CM | POA: Diagnosis not present

## 2015-01-02 DIAGNOSIS — D649 Anemia, unspecified: Secondary | ICD-10-CM | POA: Diagnosis not present

## 2015-01-02 DIAGNOSIS — O99345 Other mental disorders complicating the puerperium: Secondary | ICD-10-CM | POA: Insufficient documentation

## 2015-01-02 DIAGNOSIS — Z888 Allergy status to other drugs, medicaments and biological substances status: Secondary | ICD-10-CM | POA: Insufficient documentation

## 2015-01-02 DIAGNOSIS — F418 Other specified anxiety disorders: Secondary | ICD-10-CM | POA: Insufficient documentation

## 2015-01-02 DIAGNOSIS — Z3A00 Weeks of gestation of pregnancy not specified: Secondary | ICD-10-CM | POA: Insufficient documentation

## 2015-01-02 DIAGNOSIS — F4310 Post-traumatic stress disorder, unspecified: Secondary | ICD-10-CM | POA: Diagnosis not present

## 2015-01-02 DIAGNOSIS — F319 Bipolar disorder, unspecified: Secondary | ICD-10-CM | POA: Diagnosis not present

## 2015-01-02 DIAGNOSIS — Z302 Encounter for sterilization: Secondary | ICD-10-CM | POA: Diagnosis present

## 2015-01-02 HISTORY — PX: LAPAROSCOPIC TUBAL LIGATION: SHX1937

## 2015-01-02 LAB — PREGNANCY, URINE: Preg Test, Ur: NEGATIVE

## 2015-01-02 SURGERY — LIGATION, FALLOPIAN TUBE, LAPAROSCOPIC
Anesthesia: General | Laterality: Bilateral

## 2015-01-02 MED ORDER — LIDOCAINE HCL (CARDIAC) 20 MG/ML IV SOLN
INTRAVENOUS | Status: DC | PRN
  Administered 2015-01-02: 50 mg via INTRAVENOUS

## 2015-01-02 MED ORDER — FENTANYL CITRATE (PF) 100 MCG/2ML IJ SOLN
INTRAMUSCULAR | Status: DC | PRN
  Administered 2015-01-02: 25 ug via INTRAVENOUS
  Administered 2015-01-02: 100 ug via INTRAVENOUS
  Administered 2015-01-02: 50 ug via INTRAVENOUS
  Administered 2015-01-02: 25 ug via INTRAVENOUS
  Administered 2015-01-02 (×2): 50 ug via INTRAVENOUS

## 2015-01-02 MED ORDER — DEXAMETHASONE SODIUM PHOSPHATE 10 MG/ML IJ SOLN
INTRAMUSCULAR | Status: DC | PRN
  Administered 2015-01-02: 4 mg via INTRAVENOUS

## 2015-01-02 MED ORDER — BUPIVACAINE HCL (PF) 0.25 % IJ SOLN
INTRAMUSCULAR | Status: AC
  Filled 2015-01-02: qty 30

## 2015-01-02 MED ORDER — GLYCOPYRROLATE 0.2 MG/ML IJ SOLN
INTRAMUSCULAR | Status: AC
  Filled 2015-01-02: qty 1

## 2015-01-02 MED ORDER — PROPOFOL 10 MG/ML IV BOLUS
INTRAVENOUS | Status: DC | PRN
  Administered 2015-01-02: 200 mg via INTRAVENOUS

## 2015-01-02 MED ORDER — DEXAMETHASONE SODIUM PHOSPHATE 4 MG/ML IJ SOLN
INTRAMUSCULAR | Status: AC
  Filled 2015-01-02: qty 1

## 2015-01-02 MED ORDER — NEOSTIGMINE METHYLSULFATE 10 MG/10ML IV SOLN
INTRAVENOUS | Status: AC
  Filled 2015-01-02: qty 1

## 2015-01-02 MED ORDER — HYDROCODONE-ACETAMINOPHEN 7.5-325 MG PO TABS: 1.0000 | ORAL_TABLET | Freq: Once | ORAL | Status: DC | PRN

## 2015-01-02 MED ORDER — PROPOFOL 10 MG/ML IV BOLUS
INTRAVENOUS | Status: AC
  Filled 2015-01-02: qty 20

## 2015-01-02 MED ORDER — KETOROLAC TROMETHAMINE 30 MG/ML IJ SOLN
INTRAMUSCULAR | Status: AC
  Filled 2015-01-02: qty 1

## 2015-01-02 MED ORDER — ROCURONIUM BROMIDE 100 MG/10ML IV SOLN
INTRAVENOUS | Status: DC | PRN
  Administered 2015-01-02: 30 mg via INTRAVENOUS

## 2015-01-02 MED ORDER — BUPIVACAINE HCL (PF) 0.25 % IJ SOLN
INTRAMUSCULAR | Status: DC | PRN
  Administered 2015-01-02: 25 mL

## 2015-01-02 MED ORDER — MEPERIDINE HCL 25 MG/ML IJ SOLN: 6.2500 mg | INTRAMUSCULAR | Status: DC | PRN

## 2015-01-02 MED ORDER — ONDANSETRON HCL 4 MG/2ML IJ SOLN
INTRAMUSCULAR | Status: DC | PRN
  Administered 2015-01-02: 4 mg via INTRAVENOUS

## 2015-01-02 MED ORDER — OXYCODONE-ACETAMINOPHEN 5-325 MG PO TABS: 1.0000 | ORAL_TABLET | ORAL | Status: AC | PRN

## 2015-01-02 MED ORDER — MIDAZOLAM HCL 2 MG/2ML IJ SOLN
INTRAMUSCULAR | Status: AC
  Filled 2015-01-02: qty 2

## 2015-01-02 MED ORDER — KETOROLAC TROMETHAMINE 30 MG/ML IJ SOLN
INTRAMUSCULAR | Status: DC | PRN
  Administered 2015-01-02: 30 mg via INTRAVENOUS

## 2015-01-02 MED ORDER — FENTANYL CITRATE (PF) 100 MCG/2ML IJ SOLN: 25.0000 ug | INTRAMUSCULAR | Status: DC | PRN

## 2015-01-02 MED ORDER — FENTANYL CITRATE (PF) 100 MCG/2ML IJ SOLN
INTRAMUSCULAR | Status: AC
  Filled 2015-01-02: qty 2

## 2015-01-02 MED ORDER — GLYCOPYRROLATE 0.2 MG/ML IJ SOLN
INTRAMUSCULAR | Status: DC | PRN
  Administered 2015-01-02: .5 mg via INTRAVENOUS

## 2015-01-02 MED ORDER — METOCLOPRAMIDE HCL 5 MG/ML IJ SOLN: 10.0000 mg | Freq: Once | INTRAMUSCULAR | Status: DC | PRN

## 2015-01-02 MED ORDER — SCOPOLAMINE 1 MG/3DAYS TD PT72
MEDICATED_PATCH | TRANSDERMAL | Status: AC
  Administered 2015-01-02: 1.5 mg via TRANSDERMAL
  Filled 2015-01-02: qty 1

## 2015-01-02 MED ORDER — LACTATED RINGERS IV SOLN
INTRAVENOUS | Status: DC
  Administered 2015-01-02 (×3): via INTRAVENOUS

## 2015-01-02 MED ORDER — 0.9 % SODIUM CHLORIDE (POUR BTL) OPTIME
TOPICAL | Status: DC | PRN
  Administered 2015-01-02: 1000 mL

## 2015-01-02 MED ORDER — IBUPROFEN 600 MG PO TABS: 600.0000 mg | ORAL_TABLET | Freq: Four times a day (QID) | ORAL | Status: AC | PRN

## 2015-01-02 MED ORDER — MIDAZOLAM HCL 2 MG/2ML IJ SOLN
INTRAMUSCULAR | Status: DC | PRN
  Administered 2015-01-02: 2 mg via INTRAVENOUS

## 2015-01-02 MED ORDER — ONDANSETRON HCL 4 MG/2ML IJ SOLN
INTRAMUSCULAR | Status: AC
  Filled 2015-01-02: qty 2

## 2015-01-02 MED ORDER — NEOSTIGMINE METHYLSULFATE 10 MG/10ML IV SOLN
INTRAVENOUS | Status: DC | PRN
  Administered 2015-01-02: 3 mg via INTRAVENOUS

## 2015-01-02 MED ORDER — SCOPOLAMINE 1 MG/3DAYS TD PT72
1.0000 | MEDICATED_PATCH | Freq: Once | TRANSDERMAL | Status: DC
  Administered 2015-01-02: 1.5 mg via TRANSDERMAL

## 2015-01-02 MED ORDER — LIDOCAINE HCL (PF) 1 % IJ SOLN
INTRAMUSCULAR | Status: AC
  Filled 2015-01-02: qty 5

## 2015-01-02 MED ORDER — ROCURONIUM BROMIDE 100 MG/10ML IV SOLN
INTRAVENOUS | Status: AC
  Filled 2015-01-02: qty 1

## 2015-01-02 SURGICAL SUPPLY — 20 items
CATH ROBINSON RED A/P 16FR (CATHETERS) ×3 IMPLANT
CHLORAPREP W/TINT 26ML (MISCELLANEOUS) ×3 IMPLANT
CLOTH BEACON ORANGE TIMEOUT ST (SAFETY) ×3 IMPLANT
DRSG COVADERM PLUS 2X2 (GAUZE/BANDAGES/DRESSINGS) ×6 IMPLANT
DRSG OPSITE POSTOP 3X4 (GAUZE/BANDAGES/DRESSINGS) IMPLANT
GLOVE BIOGEL PI IND STRL 7.0 (GLOVE) ×1 IMPLANT
GLOVE BIOGEL PI INDICATOR 7.0 (GLOVE) ×2
GLOVE ECLIPSE 6.5 STRL STRAW (GLOVE) ×3 IMPLANT
GOWN STRL REUS W/TWL LRG LVL3 (GOWN DISPOSABLE) ×6 IMPLANT
LIQUID BAND (GAUZE/BANDAGES/DRESSINGS) ×3 IMPLANT
NEEDLE SPNL 22GX7 QUINCKE BK (NEEDLE) ×3 IMPLANT
PACK LAPAROSCOPY BASIN (CUSTOM PROCEDURE TRAY) ×3 IMPLANT
PAD POSITIONER PINK NONSTERILE (MISCELLANEOUS) ×3 IMPLANT
SOLUTION ELECTROLUBE (MISCELLANEOUS) IMPLANT
SUT MNCRL AB 3-0 PS2 27 (SUTURE) ×3 IMPLANT
SUT VICRYL 0 UR6 27IN ABS (SUTURE) ×3 IMPLANT
TOWEL OR 17X24 6PK STRL BLUE (TOWEL DISPOSABLE) ×6 IMPLANT
TROCAR BALLN 12MMX100 BLUNT (TROCAR) ×3 IMPLANT
WARMER LAPAROSCOPE (MISCELLANEOUS) ×3 IMPLANT
WATER STERILE IRR 1000ML POUR (IV SOLUTION) ×3 IMPLANT

## 2015-01-02 NOTE — Anesthesia Preprocedure Evaluation (Signed)
Anesthesia Evaluation    Airway Mallampati: III  TM Distance: >3 FB Neck ROM: Full    Dental no notable dental hx. (+) Teeth Intact   Pulmonary former smoker,  breath sounds clear to auscultation  Pulmonary exam normal       Cardiovascular negative cardio ROS Normal cardiovascular examRhythm:Regular Rate:Normal     Neuro/Psych  Headaches, PSYCHIATRIC DISORDERS Anxiety Depression Bipolar Disorder PTSD   GI/Hepatic negative GI ROS, Neg liver ROS,   Endo/Other  Obesity  Renal/GU negative Renal ROS  negative genitourinary   Musculoskeletal Eczema   Abdominal (+) + obese,   Peds  Hematology  (+) anemia ,   Anesthesia Other Findings   Reproductive/Obstetrics Desires sterilization                             Anesthesia Physical  Anesthesia Plan  ASA: II  Anesthesia Plan: General   Post-op Pain Management:    Induction: Intravenous  Airway Management Planned: Oral ETT  Additional Equipment:   Intra-op Plan:   Post-operative Plan: Extubation in OR  Informed Consent: I have reviewed the patients History and Physical, chart, labs and discussed the procedure including the risks, benefits and alternatives for the proposed anesthesia with the patient or authorized representative who has indicated his/her understanding and acceptance.   Dental advisory given  Plan Discussed with: Anesthesiologist, CRNA and Surgeon  Anesthesia Plan Comments:         Anesthesia Quick Evaluation

## 2015-01-02 NOTE — Op Note (Signed)
Preoperative diagnosis: Desire for sterilization, s/p right salpyngectomy  Postoperative diagnosis: Same  Anesthesia: Gen.  Anesthesiologist: Dr. Arby BarretteHatchett  Procedure: Left salpyngectomy via laparoscopy   Surgeon: Dr. Dois DavenportSandra Molly Maselli  Estimated blood loss: Minimal  Procedure:  After being informed of the planned procedure with possible complications including bleeding, infection, injury to other organs, irreversibility of tubal ligation and failure rate of 1 in 500, informed consent is obtained and the patient is taken to OR # 9. She is placed in lithotomy position, prepped and draped in [name] sterile fashion, and her bladder is emptied with an in and out red rubber catheter.  Pelvic exam: Normal external genitalia, normal cervix, normal uterus, normal adnexa  [name] speculum is inserted in the vagina and the anterior lip of the cervix was grasped with tenaculum forcep. An acorn intrauterine manipulator is easily positioned and the speculum is removed.  We infiltrate the umbilical area using 10 cc of Marcaine 0.25 and perform [name] semi-elliptical incision which is brought down bluntly to the fascia. The fascia is identified and grasped with Coker forceps. The fascia is opened with Mayo scissors. Peritoneum is entered bluntly. [name] pursestring suture of 0 Vicryl is placed on the fascia. [name] 10 mm Hassan trocar is easily inserted in the abdominal cavity and is held in place both by the intrauterine balloon and the previously placed pursestring suture. This allows for easy insufflation of the pneumoperitoneum using warm CO2 at [name] maximum pressure of 15 mmHg. [name] operative laparoscope is inserted in the abdominal cavity.  Observation: Anterior cul-de-sac is normal, posterior cul-de-sac is normal, uterus is normal. Left  tubes and ovary and right ovary are normal.Right tube is absent.  Appendix is not visualized and normal. Liver is visualized and normal. Gallbladder is not  visualized.  We place 25 mm trocar in the  left and right lower quadrant under direct visualization after infiltrating each site with 10 cc of Marcaine 0.25. The left tube is grasped and using [name] bipolar cauterization, we cauterized the tube at its insertion in the uterus and the the mesosalpinx. This allows complete resection of the left tube. The tube is exteriorized through the right lower quadrant trocar. Hemostasis is adequate.  All instruments are then removed after evacuating the pneumoperitoneum. The fascia of the umbilical incision is closed with the purse string suture of 0 Vicryl. The skin of all 3 incisions is closed with [name] subcuticular suture of 3-0 Monocryl and Dermabond.  Instrument and sponge count is complete x2. Estimated blood loss is minimal.The procedure is well tolerated by the patient is taken to recovery room in [name] well and stable condition.    Specimen: Left tube sent to pathology  [facility] Behavioral Health SystemRIVARD,Mallory Winberg A  MD [email or web address]

## 2015-01-02 NOTE — Discharge Instructions (Signed)
POST-OPERATIVE INSTRUCTIONS TO PATIENT  Call [facility]  [phone])  for excessive pain, bleeding or temperature greater than or equal to 100.4 degrees (orally).    No driving for 1 day No lifting (more than 20 lbs) for 4 weeks   Pain management:  Use Ibuprofen 600 mg every 6 hours for 5 days and then as needed. Use your pain medication as needed to maintain a pain level at or below 3/10 Use Colace 1-2 capsules per day as long as you are using pain  medication to avoid constipation.       Diet: normal  Bathing: may shower day after surgery  Wound Care: keep incisions clean and dry  Return to Dr. Estanislado Pandy as scheduled    [email or web address],JYNWGN A MD 01/02/2015 2:20 PM DISCHARGE INSTRUCTIONS: Laparoscopy  The following instructions have been prepared to help you care for yourself upon your return home today.  Wound care:  Do not get the incision wet for the first 24 hours. The incision should be kept clean and dry.  The Band-Aids or dressings may be removed the day after surgery.  Should the incision become sore, red, and swollen after the first week, check with your doctor.  Personal hygiene:  Shower the day after your procedure.  Activity and limitations:  Do NOT drive or operate any equipment today.  Do NOT lift anything more than 15 pounds for 2-3 weeks after surgery.  Do NOT rest in bed all day.  Walking is encouraged. Walk each day, starting slowly with 5-minute walks 3 or 4 times a day. Slowly increase the length of your walks.  Walk up and down stairs slowly.  Do NOT do strenuous activities, such as golfing, playing tennis, bowling, running, biking, weight lifting, gardening, mowing, or vacuuming for 2-4 weeks. Ask your doctor when it is okay to start.  Diet: Eat a light meal as desired this evening. You may resume your usual diet tomorrow.  Return to work: This is dependent on the type of work you do. For the most part you can return to a desk job  within a week of surgery. If you are more active at work, please discuss this with your doctor.  What to expect after your surgery: You may have a slight burning sensation when you urinate on the first day. You may have a very small amount of blood in the urine. Expect to have a small amount of vaginal discharge/light bleeding for 1-2 weeks. It is not unusual to have abdominal soreness and bruising for up to 2 weeks. You may be tired and need more rest for about 1 week. You may experience shoulder pain for 24-72 hours. Lying flat in bed may relieve it.  Call your doctor for any of the following:  Develop a fever of 100.4 or greater  Inability to urinate 6 hours after discharge from hospital  Severe pain not relieved by pain medications  Persistent of heavy bleeding at incision site  Redness or swelling around incision site after a week  Increasing nausea or vomiting  Patient Signature________________________________________ Nurse Signature_________________________________________ Post Anesthesia Home Care Instructions  Activity: Get plenty of rest for the remainder of the day. A responsible adult should stay with you for 24 hours following the procedure.  For the next 24 hours, DO NOT: -Drive a car -Advertising copywriter -Drink alcoholic beverages -Take any medication unless instructed by your physician -Make any legal decisions or sign important papers.  Meals: Start with liquid foods such as gelatin or  soup. Progress to regular foods as tolerated. Avoid greasy, spicy, heavy foods. If nausea and/or vomiting occur, drink only clear liquids until the nausea and/or vomiting subsides. Call your physician if vomiting continues.  Special Instructions/Symptoms: Your throat may feel dry or sore from the anesthesia or the breathing tube placed in your throat during surgery. If this causes discomfort, gargle with warm salt water. The discomfort should disappear within 24 hours.  If you had a  scopolamine patch placed behind your ear for the management of post- operative nausea and/or vomiting:  1. The medication in the patch is effective for 72 hours, after which it should be removed.  Wrap patch in a tissue and discard in the trash. Wash hands thoroughly with soap and water. 2. You may remove the patch earlier than 72 hours if you experience unpleasant side effects which may include dry mouth, dizziness or visual disturbances. 3. Avoid touching the patch. Wash your hands with soap and water after contact with the patch.

## 2015-01-02 NOTE — H&P (View-Only) (Signed)
[name] is a 34 y.o.  female P: 3-0-3-3 presents for tubal sterilization for permanent sterilization.  Patient is S/P vaginal delivery 11/29/2014 and is currently using Depo Provera.  She has used, in the past oral contraceptives and the Nexplanon implantable device for contraception.  The patient has decided that she no longer wants to bear children and desires  laparoscopic tubal sterilization since she has already had her right tube removed due to an ectopic in 2014.   Past Medical History  OB History: G:6;   P: 3-0-3-3;  SVB: 2010,  2013 and 2016    (ectopic 2014)   GYN History: menarche: 14;    LMP: post-partum/breastfeeding    Contracepton Depo-Provera  The patient reports a past history of: chlamydia and herpes.  Denies history of abnormal PAP smear.  Last PAP smear: 2014-normal  Medical History: Migraine, Anxiety, Bipolar Disorder, PTSD and Gallstones  Surgical History:  1996- Cosmetic Facial Surgery (due to dog bite); 2003-Right Hand/Wrist Ligament Surgery; 2010-Cholecystectomy and 2014-Right Salpingectomy for Ectopic  Denies problems with anesthesia or history of blood transfusions  Family History: Asthma, Diabetes Mellitus, Heart Disease, Hodgkin's Lymphoma, Arthritis, Migraine, Alcoholism, Stroke, Thyroid Disease and Ovarian Cancer d Social History:  Divorced  and a Student;  Former Smoker and occasional alcohol use   Outpatient Encounter Prescriptions as of 12/26/2014  Medication Sig  . Prenatal Vit-Fe Fumarate-FA (PRENATAL MULTIVITAMIN) TABS tablet Take 1 tablet by mouth daily at 12 noon.   No facility-administered encounter medications on file as of 12/26/2014.    Allergies  Allergen Reactions  . Biaxin [Clarithromycin] Swelling  . Sulfa Antibiotics Swelling    Denies sensitivity to peanuts, shellfish, soy, latex or adhesives.  ROS: Admits to reading glasses but  denies headache, vision changes, nasal congestion, dysphagia, tinnitus, dizziness, hoarseness, cough,   chest pain, shortness of breath, nausea, vomiting, diarrhea,constipation,  urinary frequency, urgency  dysuria, hematuria, vaginitis symptoms, pelvic pain, swelling of joints,easy bruising,  myalgias, arthralgias, skin rashes, unexplained weight loss and except as is mentioned in the history of present illness, patient's review of systems is otherwise negative.   Physical Exam  Bp: 102/60    P: 84    R: 18     Temperature: 98.5 degrees F orally   Weight:  203 lbs.  Heght: 5' 8"  BMI: 30.9  Neck: supple without masses or thyromegaly Lungs: clear to auscultation Heart: regular rate and rhythm Abdomen: soft, non-tender and no organomegaly Pelvic:EGBUS- wnl; vagina-normal rugae with scant brown discharge; uterus-upper limits of normal size, cervix without lesions or motion tenderness; adnexae-no tenderness or masses Extremities:  no clubbing, cyanosis or edema   Assesment: Desire for Sterilzation   Disposition:  A discussion was held with patient regarding the indication for her procedure(s) along with the risks, which include but are not limited to: reaction to anesthesia, damage to adjacent organs, infection,  excessive bleeding and a  2  in 500 risk of failure.  The patient verbalized understanding of these risks and has consented to proceed with Laparoscopic Tubal Sterilization on January 02, 2015.   CSN# [id number]   Hartleigh Edmonston J. Kaiulani Sitton, PA-C  for Dr. Sandra A. Rivard  

## 2015-01-02 NOTE — Transfer of Care (Signed)
Immediate Anesthesia Transfer of Care Note  Patient: [name]  Procedure(s) Performed: Procedure(s): LAPAROSCOPIC TUBAL LIGATION (Bilateral)  Patient Location: PACU  Anesthesia Type:General  Level of Consciousness: awake, alert , oriented and patient cooperative  Airway & Oxygen Therapy: Patient Spontanous Breathing  Post-op Assessment: Report given to RN, Post -op Vital signs reviewed and stable and Patient moving all extremities X 4  Post vital signs: Reviewed and stable  Last Vitals:  Filed Vitals:   01/02/15 1049  BP: 107/72  Pulse: 68  Temp: 37.2 C  Resp: 16    Complications: No apparent anesthesia complications

## 2015-01-02 NOTE — Anesthesia Procedure Notes (Signed)
Procedure Name: Intubation Date/Time: 01/02/2015 12:49 PM Performed by: Wilder GladeWINN, Shahin Knierim G Pre-anesthesia Checklist: Patient identified, Emergency Drugs available, Suction available, Patient being monitored and Timeout performed Patient Re-evaluated:Patient Re-evaluated prior to inductionOxygen Delivery Method: Circle system utilized Preoxygenation: Pre-oxygenation with 100% oxygen Intubation Type: IV induction Ventilation: Mask ventilation without difficulty Laryngoscope Size: Miller and 2 Grade View: Grade I Tube type: Oral Tube size: 7.0 mm Number of attempts: 1 Placement Confirmation: ETT inserted through vocal cords under direct vision,  positive ETCO2 and breath sounds checked- equal and bilateral Secured at: 21 cm Tube secured with: Tape Dental Injury: Teeth and Oropharynx as per pre-operative assessment

## 2015-01-02 NOTE — Anesthesia Postprocedure Evaluation (Signed)
Anesthesia Post Note  Patient: [name]  Procedure(s) Performed: Procedure(s) (LRB): LAPAROSCOPIC TUBAL LIGATION (Bilateral)  Anesthesia type: General  Patient location: PACU  Post pain: Pain level controlled  Post assessment: Post-op Vital signs reviewed  Last Vitals:  Filed Vitals:   01/02/15 1500  BP: 106/57  Pulse: 55  Temp:   Resp: 12    Post vital signs: Reviewed  Level of consciousness: sedated  Complications: No apparent anesthesia complications

## 2015-01-02 NOTE — Interval H&P Note (Signed)
History and Physical Interval Note:  01/02/2015 12:10 PM  [name]  has presented today for surgery, with the diagnosis of DESIRE STERILIZATION  The various methods of treatment have been discussed with the patient and family. After consideration of risks, benefits and other options for treatment, the patient has consented to  Procedure(s): LAPAROSCOPIC TUBAL LIGATION (Bilateral) as a surgical intervention .  The patient's history has been reviewed, patient examined, no change in status, stable for surgery.  I have reviewed the patient's chart and labs.  Questions were answered to the patient's satisfaction.     Rosana Farnell A

## 2015-01-03 ENCOUNTER — Encounter (HOSPITAL_COMMUNITY): Payer: Self-pay | Admitting: Obstetrics and Gynecology

## 2015-06-21 ENCOUNTER — Other Ambulatory Visit: Payer: Self-pay

## 2015-07-13 ENCOUNTER — Other Ambulatory Visit: Payer: Self-pay | Admitting: Obstetrics & Gynecology

## 2015-07-14 ENCOUNTER — Ambulatory Visit (HOSPITAL_COMMUNITY): Payer: Medicaid Other | Admitting: Anesthesiology

## 2015-07-14 ENCOUNTER — Encounter (HOSPITAL_COMMUNITY)
Admission: RE | Disposition: A | Discharge status: Home / Self Care | Payer: Self-pay | Source: Ambulatory Visit | Attending: Obstetrics & Gynecology

## 2015-07-14 ENCOUNTER — Ambulatory Visit (HOSPITAL_COMMUNITY)
Admission: RE | Admit: 2015-07-14 | Discharge: 2015-07-14 | Disposition: A | Discharge status: Home / Self Care | Payer: Medicaid Other | Source: Ambulatory Visit | Attending: Obstetrics & Gynecology | Admitting: Obstetrics & Gynecology

## 2015-07-14 DIAGNOSIS — Z87891 Personal history of nicotine dependence: Secondary | ICD-10-CM | POA: Diagnosis not present

## 2015-07-14 DIAGNOSIS — N841 Polyp of cervix uteri: Secondary | ICD-10-CM | POA: Insufficient documentation

## 2015-07-14 DIAGNOSIS — Z6830 Body mass index (BMI) 30.0-30.9, adult: Secondary | ICD-10-CM | POA: Diagnosis not present

## 2015-07-14 DIAGNOSIS — Q51810 Arcuate uterus: Secondary | ICD-10-CM | POA: Diagnosis not present

## 2015-07-14 DIAGNOSIS — N920 Excessive and frequent menstruation with regular cycle: Secondary | ICD-10-CM | POA: Diagnosis not present

## 2015-07-14 DIAGNOSIS — N926 Irregular menstruation, unspecified: Secondary | ICD-10-CM | POA: Diagnosis present

## 2015-07-14 HISTORY — PX: HYSTEROSCOPY: SHX211

## 2015-07-14 LAB — URINALYSIS, ROUTINE W REFLEX MICROSCOPIC
Bilirubin Urine: NEGATIVE
Bilirubin Urine: NEGATIVE
GLUCOSE, UA: NEGATIVE mg/dL
GLUCOSE, UA: NEGATIVE mg/dL
KETONES UR: NEGATIVE mg/dL
KETONES UR: NEGATIVE mg/dL
Nitrite: NEGATIVE
Nitrite: NEGATIVE
PH: 6.5 (ref 5.0–8.0)
Protein, ur: NEGATIVE mg/dL
Protein, ur: NEGATIVE mg/dL
SPECIFIC GRAVITY, URINE: 1.015 (ref 1.005–1.030)
SPECIFIC GRAVITY, URINE: 1.015 (ref 1.005–1.030)
pH: 7 (ref 5.0–8.0)

## 2015-07-14 LAB — CBC
HEMATOCRIT: 43.5 % (ref 36.0–46.0)
Hemoglobin: 14.2 g/dL (ref 12.0–15.0)
MCH: 29.3 pg (ref 26.0–34.0)
MCHC: 32.6 g/dL (ref 30.0–36.0)
MCV: 89.7 fL (ref 78.0–100.0)
PLATELETS: 368 10*3/uL (ref 150–400)
RBC: 4.85 MIL/uL (ref 3.87–5.11)
RDW: 12.7 % (ref 11.5–15.5)
WBC: 6 10*3/uL (ref 4.0–10.5)

## 2015-07-14 LAB — URINE MICROSCOPIC-ADD ON

## 2015-07-14 LAB — PREGNANCY, URINE: Preg Test, Ur: NEGATIVE

## 2015-07-14 SURGERY — ABLATION, ENDOMETRIUM, HYSTEROSCOPIC
Anesthesia: General | Site: Vagina

## 2015-07-14 MED ORDER — DEXAMETHASONE SODIUM PHOSPHATE 4 MG/ML IJ SOLN
INTRAMUSCULAR | Status: AC
  Filled 2015-07-14: qty 1

## 2015-07-14 MED ORDER — NITROFURANTOIN MONOHYD MACRO 100 MG PO CAPS: 100.0000 mg | ORAL_CAPSULE | Freq: Two times a day (BID) | ORAL | Status: DC

## 2015-07-14 MED ORDER — SODIUM CHLORIDE 0.9 % IR SOLN
Status: DC | PRN
  Administered 2015-07-14: 3000 mL

## 2015-07-14 MED ORDER — OXYCODONE-ACETAMINOPHEN 5-325 MG PO TABS: 1.0000 | ORAL_TABLET | ORAL | Status: AC | PRN

## 2015-07-14 MED ORDER — PROPOFOL 10 MG/ML IV BOLUS
INTRAVENOUS | Status: DC | PRN
  Administered 2015-07-14: 200 mg via INTRAVENOUS

## 2015-07-14 MED ORDER — ONDANSETRON HCL 4 MG/2ML IJ SOLN
INTRAMUSCULAR | Status: AC
Start: 2015-07-14 — End: 2015-07-14
  Filled 2015-07-14: qty 2

## 2015-07-14 MED ORDER — DEXAMETHASONE SODIUM PHOSPHATE 10 MG/ML IJ SOLN
INTRAMUSCULAR | Status: DC | PRN
  Administered 2015-07-14: 4 mg via INTRAVENOUS

## 2015-07-14 MED ORDER — KETOROLAC TROMETHAMINE 30 MG/ML IJ SOLN
INTRAMUSCULAR | Status: DC | PRN
  Administered 2015-07-14: 30 mg via INTRAVENOUS

## 2015-07-14 MED ORDER — MIDAZOLAM HCL 2 MG/2ML IJ SOLN
INTRAMUSCULAR | Status: AC
  Filled 2015-07-14: qty 2

## 2015-07-14 MED ORDER — LACTATED RINGERS IV SOLN
INTRAVENOUS | Status: DC
  Administered 2015-07-14 (×2): via INTRAVENOUS

## 2015-07-14 MED ORDER — BUPIVACAINE-EPINEPHRINE (PF) 0.5% -1:200000 IJ SOLN
INTRAMUSCULAR | Status: AC
  Filled 2015-07-14: qty 30

## 2015-07-14 MED ORDER — BUPIVACAINE-EPINEPHRINE 0.5% -1:200000 IJ SOLN
INTRAMUSCULAR | Status: DC | PRN
  Administered 2015-07-14: 10 mL

## 2015-07-14 MED ORDER — HYDROMORPHONE HCL 1 MG/ML IJ SOLN
0.2500 mg | INTRAMUSCULAR | Status: DC | PRN
  Administered 2015-07-14 (×2): 0.5 mg via INTRAVENOUS

## 2015-07-14 MED ORDER — HYDROMORPHONE HCL 1 MG/ML IJ SOLN
INTRAMUSCULAR | Status: DC
Start: 2015-07-14 — End: 2015-07-14
  Filled 2015-07-14: qty 1

## 2015-07-14 MED ORDER — PROPOFOL 10 MG/ML IV BOLUS
INTRAVENOUS | Status: AC
Start: 2015-07-14 — End: 2015-07-14
  Filled 2015-07-14: qty 20

## 2015-07-14 MED ORDER — MIDAZOLAM HCL 2 MG/2ML IJ SOLN: 0.5000 mg | Freq: Once | INTRAMUSCULAR | Status: DC | PRN

## 2015-07-14 MED ORDER — FENTANYL CITRATE (PF) 100 MCG/2ML IJ SOLN
INTRAMUSCULAR | Status: DC | PRN
  Administered 2015-07-14: 100 ug via INTRAVENOUS

## 2015-07-14 MED ORDER — LIDOCAINE HCL (CARDIAC) 20 MG/ML IV SOLN
INTRAVENOUS | Status: AC
  Filled 2015-07-14: qty 5

## 2015-07-14 MED ORDER — ONDANSETRON HCL 4 MG/2ML IJ SOLN
INTRAMUSCULAR | Status: DC | PRN
  Administered 2015-07-14: 4 mg via INTRAVENOUS

## 2015-07-14 MED ORDER — MIDAZOLAM HCL 2 MG/2ML IJ SOLN
INTRAMUSCULAR | Status: DC | PRN
  Administered 2015-07-14: 2 mg via INTRAVENOUS

## 2015-07-14 MED ORDER — PROMETHAZINE HCL 25 MG/ML IJ SOLN
6.2500 mg | INTRAMUSCULAR | Status: DC | PRN
Start: 2015-07-14 — End: 2015-07-14

## 2015-07-14 MED ORDER — LIDOCAINE HCL (CARDIAC) 20 MG/ML IV SOLN
INTRAVENOUS | Status: DC | PRN
  Administered 2015-07-14: 20 mg via INTRAVENOUS

## 2015-07-14 MED ORDER — FENTANYL CITRATE (PF) 100 MCG/2ML IJ SOLN
INTRAMUSCULAR | Status: AC
  Filled 2015-07-14: qty 2

## 2015-07-14 MED ORDER — MEPERIDINE HCL 25 MG/ML IJ SOLN: 6.2500 mg | INTRAMUSCULAR | Status: DC | PRN

## 2015-07-14 MED ORDER — IBUPROFEN 600 MG PO TABS: 600.0000 mg | ORAL_TABLET | Freq: Four times a day (QID) | ORAL | Status: AC | PRN

## 2015-07-14 SURGICAL SUPPLY — 15 items
CANISTER SUCT 3000ML (MISCELLANEOUS) ×3 IMPLANT
CATH ROBINSON RED A/P 16FR (CATHETERS) ×3 IMPLANT
CLOTH BEACON ORANGE TIMEOUT ST (SAFETY) ×3 IMPLANT
CONTAINER PREFILL 10% NBF 60ML (FORM) ×6 IMPLANT
ELECT REM PT RETURN 9FT ADLT (ELECTROSURGICAL)
ELECTRODE REM PT RTRN 9FT ADLT (ELECTROSURGICAL) IMPLANT
GLOVE BIOGEL PI IND STRL 7.0 (GLOVE) ×1 IMPLANT
GLOVE BIOGEL PI INDICATOR 7.0 (GLOVE) ×2
GLOVE SURG SS PI 6.5 STRL IVOR (GLOVE) ×6 IMPLANT
GOWN STRL REUS W/TWL LRG LVL3 (GOWN DISPOSABLE) ×6 IMPLANT
PACK VAGINAL MINOR WOMEN LF (CUSTOM PROCEDURE TRAY) ×3 IMPLANT
PAD OB MATERNITY 4.3X12.25 (PERSONAL CARE ITEMS) ×3 IMPLANT
SET GENESYS HTA PROCERVA (MISCELLANEOUS) ×3 IMPLANT
TOWEL OR 17X24 6PK STRL BLUE (TOWEL DISPOSABLE) ×6 IMPLANT
WATER STERILE IRR 1000ML POUR (IV SOLUTION) ×3 IMPLANT

## 2015-07-14 NOTE — Anesthesia Preprocedure Evaluation (Addendum)
Anesthesia Evaluation  Patient identified by MRN, date of birth, ID band Patient awake    Reviewed: Allergy & Precautions, NPO status , Patient's Chart, lab work & pertinent test results  History of Anesthesia Complications Negative for: history of anesthetic complications  Airway Mallampati: I  TM Distance: >3 FB Neck ROM: Full    Dental  (+) Dental Advisory Given   Pulmonary former smoker (quit 2013),    breath sounds clear to auscultation       Cardiovascular negative cardio ROS   Rhythm:Regular Rate:Normal     Neuro/Psych PSYCHIATRIC DISORDERS (PTSD) Anxiety Depression Bipolar Disorder negative neurological ROS     GI/Hepatic negative GI ROS, Neg liver ROS,   Endo/Other  Morbid obesity  Renal/GU negative Renal ROS     Musculoskeletal   Abdominal (+) + obese,   Peds  Hematology negative hematology ROS (+)   Anesthesia Other Findings   Reproductive/Obstetrics                            Anesthesia Physical Anesthesia Plan  ASA: II  Anesthesia Plan: General   Post-op Pain Management:    Induction: Intravenous  Airway Management Planned: LMA  Additional Equipment:   Intra-op Plan:   Post-operative Plan:   Informed Consent: I have reviewed the patients History and Physical, chart, labs and discussed the procedure including the risks, benefits and alternatives for the proposed anesthesia with the patient or authorized representative who has indicated his/her understanding and acceptance.   Dental advisory given  Plan Discussed with: CRNA and Surgeon  Anesthesia Plan Comments: (Plan routine monitors, GA- LMA OK)        Anesthesia Quick Evaluation

## 2015-07-14 NOTE — Discharge Instructions (Signed)
Ms. Corum,  1. Nothing in vagina X 2 weeks, no sex, no tampons, no douching x 2 weeks. 2.  You will have some vaginal bleeding for several days, call me if it is excessive bleeding having to change a pad every hour. 3.  You will have abdominal cramping, take pain medication as needed, call me if pain is intolerable despite pain medication use.   4. Please call office to make an appointment to see me in 2 weeks for post- surgery check.   Call me for any other questions or issues.  Sincerely, Dr. Sallye Ober.  Post Anesthesia Home Care Instructions  Activity: Get plenty of rest for the remainder of the day. A responsible adult should stay with you for 24 hours following the procedure.  For the next 24 hours, DO NOT: -Drive a car -Advertising copywriter -Drink alcoholic beverages -Take any medication unless instructed by your physician -Make any legal decisions or sign important papers.  Meals: Start with liquid foods such as gelatin or soup. Progress to regular foods as tolerated. Avoid greasy, spicy, heavy foods. If nausea and/or vomiting occur, drink only clear liquids until the nausea and/or vomiting subsides. Call your physician if vomiting continues.  Special Instructions/Symptoms: Your throat may feel dry or sore from the anesthesia or the breathing tube placed in your throat during surgery. If this causes discomfort, gargle with warm salt water. The discomfort should disappear within 24 hours.  If you had a scopolamine patch placed behind your ear for the management of post- operative nausea and/or vomiting:  1. The medication in the patch is effective for 72 hours, after which it should be removed.  Wrap patch in a tissue and discard in the trash. Wash hands thoroughly with soap and water. 2. You may remove the patch earlier than 72 hours if you experience unpleasant side effects which may include dry mouth, dizziness or visual disturbances. 3. Avoid touching the patch. Wash your hands with  soap and water after contact with the patch.

## 2015-07-14 NOTE — Brief Op Note (Signed)
07/14/2015  3:22 PM  PATIENT:  Velora Mediate  35 y.o. female  PRE-OPERATIVE DIAGNOSIS:  Menorrhagia, Arcuate Uterus  POST-OPERATIVE DIAGNOSIS:  Menorrhagia, arcuate uterus  PROCEDURE:  Procedure(s): HYSTEROSCOPY WITH HYDROTHERMAL ABLATION, Dilatation and Currettage, Resection Endocervical Polyp (N/A)  SURGEON:  Surgeon(s) and Role:    * Hoover Browns, MD - Primary  ASSISTANTS: Scrub Technician  ANESTHESIA:   general  EBL:  Minimal IVF: 1900 cc LR Urine: 75 cc  BLOOD ADMINISTERED:none  DRAINS: none   LOCAL MEDICATIONS USED:  MARCAINE   With epinephrine, Amount: 20 ml.   SPECIMEN:  Source of Specimen:  Endocervical curetting and cervical polyp  DISPOSITION OF SPECIMEN:  PATHOLOGY  COUNTS:  YES  TOURNIQUET:  * No tourniquets in log *  DICTATION: .Dragon Dictation  PLAN OF CARE: Discharge to home after PACU  PATIENT DISPOSITION:  PACU - hemodynamically stable.   Delay start of Pharmacological VTE agent (>24hrs) due to surgical blood loss or risk of bleeding: not applicable

## 2015-07-14 NOTE — Anesthesia Postprocedure Evaluation (Signed)
Anesthesia Post Note  Patient: [name]  Procedure(s) Performed: Procedure(s) (LRB): HYSTEROSCOPY WITH HYDROTHERMAL ABLATION, Dilatation and Currettage, Resection Endocervical Polyp (N/A)  Patient location during evaluation: PACU Anesthesia Type: General Level of consciousness: awake and alert, oriented and patient cooperative Pain management: pain level controlled Vital Signs Assessment: post-procedure vital signs reviewed and stable Respiratory status: spontaneous breathing, nonlabored ventilation and respiratory function stable Cardiovascular status: blood pressure returned to baseline and stable Postop Assessment: no signs of nausea or vomiting Anesthetic complications: no    Last Vitals:  Filed Vitals:   07/14/15 0912 07/14/15 1150  BP: 137/91   Pulse: 65   Temp: 36.8 C 36.6 C  Resp: 20     Last Pain:  Filed Vitals:   07/14/15 1218  PainSc: 1                  Nyjah Schwake,E. Latha Staunton

## 2015-07-14 NOTE — Op Note (Addendum)
  Patient: [name], [place] MRN: [id number]  DOB:  July 24, 1980  Preop diagnosis: Menorrhagia, Arcuate uterus  Postop diagnosis: Menorrhagia, Arcuate uterus,Cervical polyp.   Procedures:  1.  Dilation and curettage hysteroscopy with cervical polypectomy 2. Endometrial ablation via hydrothermal ablation   Surgeon: Dr. Hoover Browns  Anesthesia: General  Complications: None  Indications: 35 year old para 3 with a history of irregular prolonged periods who desired endometrial ablation for management of her symptoms.  An office endometrial biopsy was normal.  Also with history of right salpingectomy for ectopic pregnancy and left salpingectomy for sterilization.  Procedures:  Informed consent was obtained from the patient to undergo the procedure. We discussed the risks of bleeding, infection and damage to the organs. We also discussed risks of post-tubal ablation syndrome which could necessitate a hysterectomy in the future.  She was taken to the operating room where anesthesia was administered. Patient was placed in the dorsal lithotomy position and prepped and draped in the usual sterile fashion. An exam done revealed a small anteverted uterus without any palpable adnexal masses. Speculum was placed in and the anterior cervix grasped with a tenaculum. Paracervical block was performed. The uterus was dilated with Hegar dilators up to #8. The hydrothermal ablation scope was placed in and the cavity visualized to have normal ostium and normal endometrium.  The first HTA machine had to be replaced due to an error in message in machine.  Second attempt at HTA was performed as per protocol without any complications. Cooling was allowed.  The endometrial tissue was noted to be blanched.   A small pink fleshy mass in endocervical canal, suspicious for polyp, was removed with currettage and use of polyp forceps.  All instruments were then removed and the patient was awoken from anesthesia and taken to recovery  room in stable condition  Specimen: Endometrial curettings  Disposition: Stable to PACU.

## 2015-07-14 NOTE — Anesthesia Procedure Notes (Signed)
Procedure Name: LMA Insertion Date/Time: 07/14/2015 10:33 AM Performed by: Yuan Gann, Jannet Askew Pre-anesthesia Checklist: Patient identified, Timeout performed, Emergency Drugs available, Suction available and Patient being monitored Patient Re-evaluated:Patient Re-evaluated prior to inductionOxygen Delivery Method: Circle system utilized Preoxygenation: Pre-oxygenation with 100% oxygen Intubation Type: IV induction LMA: LMA inserted LMA Size: 4.0 Number of attempts: 1 Dental Injury: Teeth and Oropharynx as per pre-operative assessment
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
## 2015-07-14 NOTE — Transfer of Care (Signed)
Immediate Anesthesia Transfer of Care Note  Patient: [name]  Procedure(s) Performed: Procedure(s): HYSTEROSCOPY WITH HYDROTHERMAL ABLATION, Dilatation and Currettage, Resection Endocervical Polyp (N/A)  Patient Location: PACU  Anesthesia Type:General  Level of Consciousness: awake and alert   Airway & Oxygen Therapy: Patient Spontanous Breathing and Patient connected to nasal cannula oxygen  Post-op Assessment: Report given to RN and Post -op Vital signs reviewed and stable  Post vital signs: Reviewed and stable  Last Vitals:  Filed Vitals:   07/14/15 0912  BP: 137/91  Pulse: 65  Temp: 36.8 C  Resp: 20    Complications: No apparent anesthesia complications

## 2015-07-14 NOTE — Interval H&P Note (Signed)
History and Physical Interval Note:  07/14/2015 9:36 AM  [name]  has presented today for surgery, with the diagnosis of Menorrhagia, Arcuate Uterus  The various methods of treatment have been discussed with the patient and family. After consideration of risks, benefits and other options for treatment, the patient has consented to  Procedure(s): HYSTEROSCOPY WITH HYDROTHERMAL ABLATION (N/A) as a surgical intervention .  The patient's history has been reviewed, patient examined, no change in status, stable for surgery.  I have reviewed the patient's chart and labs.  Questions were answered to the patient's satisfaction.     [facility]

## 2015-07-15 LAB — URINE CULTURE: Special Requests: NORMAL

## 2015-07-18 ENCOUNTER — Encounter (HOSPITAL_COMMUNITY): Payer: Self-pay | Admitting: Obstetrics & Gynecology

## 2016-12-09 ENCOUNTER — Emergency Department (HOSPITAL_COMMUNITY)
Admission: EM | Admit: 2016-12-09 | Discharge: 2016-12-09 | Disposition: A | Discharge status: Home / Self Care | Payer: Medicaid Other | Attending: Emergency Medicine | Admitting: Emergency Medicine

## 2016-12-09 ENCOUNTER — Emergency Department (HOSPITAL_COMMUNITY): Payer: Medicaid Other

## 2016-12-09 ENCOUNTER — Encounter (HOSPITAL_COMMUNITY): Payer: Self-pay | Admitting: *Deleted

## 2016-12-09 DIAGNOSIS — Y999 Unspecified external cause status: Secondary | ICD-10-CM | POA: Insufficient documentation

## 2016-12-09 DIAGNOSIS — S8991XA Unspecified injury of right lower leg, initial encounter: Secondary | ICD-10-CM | POA: Diagnosis present

## 2016-12-09 DIAGNOSIS — T148XXA Other injury of unspecified body region, initial encounter: Secondary | ICD-10-CM

## 2016-12-09 DIAGNOSIS — Z87891 Personal history of nicotine dependence: Secondary | ICD-10-CM | POA: Diagnosis not present

## 2016-12-09 DIAGNOSIS — Y92410 Unspecified street and highway as the place of occurrence of the external cause: Secondary | ICD-10-CM | POA: Insufficient documentation

## 2016-12-09 DIAGNOSIS — S80811A Abrasion, right lower leg, initial encounter: Secondary | ICD-10-CM | POA: Diagnosis not present

## 2016-12-09 DIAGNOSIS — Z79899 Other long term (current) drug therapy: Secondary | ICD-10-CM | POA: Diagnosis not present

## 2016-12-09 DIAGNOSIS — Y9355 Activity, bike riding: Secondary | ICD-10-CM | POA: Insufficient documentation

## 2016-12-09 MED ORDER — MUPIROCIN CALCIUM 2 % EX CREA: 1.0000 "application " | TOPICAL_CREAM | Freq: Two times a day (BID) | CUTANEOUS | 0 refills | Status: AC
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
## 2016-12-09 NOTE — Discharge Instructions (Signed)
Return if any problems.

## 2018-08-10 ENCOUNTER — Encounter (HOSPITAL_COMMUNITY): Payer: Self-pay | Admitting: *Deleted

## 2018-08-10 ENCOUNTER — Emergency Department (HOSPITAL_COMMUNITY)
Admission: EM | Admit: 2018-08-10 | Discharge: 2018-08-10 | Disposition: A | Discharge status: Home / Self Care | Payer: Medicaid Other | Attending: Emergency Medicine | Admitting: Emergency Medicine

## 2018-08-10 ENCOUNTER — Emergency Department (HOSPITAL_COMMUNITY): Payer: Medicaid Other

## 2018-08-10 ENCOUNTER — Other Ambulatory Visit: Payer: Self-pay

## 2018-08-10 DIAGNOSIS — Y939 Activity, unspecified: Secondary | ICD-10-CM | POA: Insufficient documentation

## 2018-08-10 DIAGNOSIS — Y92009 Unspecified place in unspecified non-institutional (private) residence as the place of occurrence of the external cause: Secondary | ICD-10-CM | POA: Insufficient documentation

## 2018-08-10 DIAGNOSIS — S1093XA Contusion of unspecified part of neck, initial encounter: Secondary | ICD-10-CM | POA: Insufficient documentation

## 2018-08-10 DIAGNOSIS — Y999 Unspecified external cause status: Secondary | ICD-10-CM | POA: Diagnosis not present

## 2018-08-10 DIAGNOSIS — S0990XA Unspecified injury of head, initial encounter: Secondary | ICD-10-CM | POA: Insufficient documentation

## 2018-08-10 DIAGNOSIS — T07XXXA Unspecified multiple injuries, initial encounter: Secondary | ICD-10-CM | POA: Diagnosis present

## 2018-08-10 DIAGNOSIS — R1110 Vomiting, unspecified: Secondary | ICD-10-CM | POA: Insufficient documentation

## 2018-08-10 LAB — CBC WITH DIFFERENTIAL/PLATELET
ABS IMMATURE GRANULOCYTES: 0.05 10*3/uL (ref 0.00–0.07)
BASOS PCT: 0 %
Basophils Absolute: 0.1 10*3/uL (ref 0.0–0.1)
Eosinophils Absolute: 0 10*3/uL (ref 0.0–0.5)
Eosinophils Relative: 0 %
HCT: 48.8 % — ABNORMAL HIGH (ref 36.0–46.0)
HEMOGLOBIN: 16.3 g/dL — AB (ref 12.0–15.0)
IMMATURE GRANULOCYTES: 0 %
LYMPHS PCT: 13 %
Lymphs Abs: 1.5 10*3/uL (ref 0.7–4.0)
MCH: 31.8 pg (ref 26.0–34.0)
MCHC: 33.4 g/dL (ref 30.0–36.0)
MCV: 95.1 fL (ref 80.0–100.0)
MONO ABS: 0.7 10*3/uL (ref 0.1–1.0)
MONOS PCT: 6 %
NEUTROS ABS: 9.2 10*3/uL — AB (ref 1.7–7.7)
NEUTROS PCT: 81 %
PLATELETS: 411 10*3/uL — AB (ref 150–400)
RBC: 5.13 MIL/uL — AB (ref 3.87–5.11)
RDW: 12.1 % (ref 11.5–15.5)
WBC: 11.5 10*3/uL — AB (ref 4.0–10.5)
nRBC: 0 % (ref 0.0–0.2)

## 2018-08-10 LAB — BASIC METABOLIC PANEL
ANION GAP: 15 (ref 5–15)
BUN: 6 mg/dL (ref 6–20)
CHLORIDE: 100 mmol/L (ref 98–111)
CO2: 21 mmol/L — ABNORMAL LOW (ref 22–32)
Calcium: 9.5 mg/dL (ref 8.9–10.3)
Creatinine, Ser: 0.76 mg/dL (ref 0.44–1.00)
GFR calc Af Amer: >60 mL/min (ref >60)
GLUCOSE: 112 mg/dL — AB (ref 70–99)
POTASSIUM: 3.5 mmol/L (ref 3.5–5.1)
Sodium: 136 mmol/L (ref 135–145)

## 2018-08-10 LAB — I-STAT BETA HCG BLOOD, ED (MC, WL, AP ONLY)

## 2018-08-10 MED ORDER — KETOROLAC TROMETHAMINE 30 MG/ML IJ SOLN
30.0000 mg | Freq: Once | INTRAMUSCULAR | Status: AC
  Administered 2018-08-10: 30 mg via INTRAVENOUS
  Filled 2018-08-10: qty 1

## 2018-08-10 MED ORDER — METHOCARBAMOL 500 MG PO TABS: 500.0000 mg | ORAL_TABLET | Freq: Two times a day (BID) | ORAL | 0 refills | Status: AC

## 2018-08-10 MED ORDER — IOPAMIDOL (ISOVUE-370) INJECTION 76%
INTRAVENOUS | Status: AC
  Filled 2018-08-10: qty 50

## 2018-08-10 MED ORDER — IOPAMIDOL (ISOVUE-370) INJECTION 76%
50.0000 mL | Freq: Once | INTRAVENOUS | Status: AC | PRN
  Administered 2018-08-10: 50 mL via INTRAVENOUS

## 2018-08-10 NOTE — Discharge Instructions (Signed)
You can take Tylenol or Ibuprofen as directed for pain. You can alternate Tylenol and Ibuprofen every 4 hours. If you take Tylenol at 1pm, then you can take Ibuprofen at 5pm. Then you can take Tylenol again at 9pm.   Take Robaxin as prescribed. This medication will make you drowsy so do not drive or drink alcohol when taking it.  As we discussed, your neck will likely be sore for the next several days.  You should eat soft foods that are easy to swallow.  Follow-up with your primary care doctor.  As we discussed, your blood pressure was slightly high here in the emergency department.  This is most likely from pain.  Please have them reassess this in about a week.  Return the emergency department for any vomiting, difficulty breathing, difficulty walking, numbness/weakness of your arms or legs or any other worsening or concerning symptoms.
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
## 2020-01-24 ENCOUNTER — Other Ambulatory Visit: Payer: Self-pay

## 2020-01-24 ENCOUNTER — Ambulatory Visit
Admission: EM | Admit: 2020-01-24 | Discharge: 2020-01-24 | Disposition: A | Discharge status: Home / Self Care | Payer: 59 | Attending: Emergency Medicine | Admitting: Emergency Medicine

## 2020-01-24 DIAGNOSIS — N3001 Acute cystitis with hematuria: Secondary | ICD-10-CM | POA: Diagnosis present

## 2020-01-24 LAB — POCT URINALYSIS DIP (MANUAL ENTRY)
Bilirubin, UA: NEGATIVE
Glucose, UA: NEGATIVE mg/dL
Ketones, POC UA: NEGATIVE mg/dL
Nitrite, UA: NEGATIVE
Protein Ur, POC: 30 mg/dL — AB
Spec Grav, UA: 1.02 (ref 1.010–1.025)
Urobilinogen, UA: 0.2 E.U./dL
pH, UA: 6 (ref 5.0–8.0)

## 2020-01-24 MED ORDER — NITROFURANTOIN MONOHYD MACRO 100 MG PO CAPS: 100.0000 mg | ORAL_CAPSULE | Freq: Two times a day (BID) | ORAL | 0 refills | Status: AC

## 2020-01-24 MED ORDER — PHENAZOPYRIDINE HCL 100 MG PO TABS: 100.0000 mg | ORAL_TABLET | Freq: Three times a day (TID) | ORAL | 0 refills | Status: AC | PRN

## 2020-01-24 NOTE — Discharge Instructions (Addendum)
Urine culture sent.  We will call you with the results.   Push fluids and get plenty of rest.   Take antibiotic as directed and to completion Take pyridium as prescribed and as needed for symptomatic relief Follow up with PCP if symptoms persists Return here or go to ER if you have any new or worsening symptoms such as fever, worsening abdominal pain, nausea/vomiting, flank pain, etc... 

## 2020-01-24 NOTE — ED Triage Notes (Signed)
Pt presents with c/o burning with urination that started last night
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
## 2020-01-25 LAB — URINE CULTURE

## 2022-02-20 ENCOUNTER — Ambulatory Visit
Admission: EM | Admit: 2022-02-20 | Discharge: 2022-02-20 | Disposition: A | Discharge status: Home / Self Care | Payer: Medicaid Other | Attending: Nurse Practitioner | Admitting: Nurse Practitioner

## 2022-02-20 DIAGNOSIS — J309 Allergic rhinitis, unspecified: Secondary | ICD-10-CM | POA: Diagnosis not present

## 2022-02-20 DIAGNOSIS — J069 Acute upper respiratory infection, unspecified: Secondary | ICD-10-CM | POA: Diagnosis not present

## 2022-02-20 MED ORDER — PSEUDOEPH-BROMPHEN-DM 30-2-10 MG/5ML PO SYRP: 5.0000 mL | ORAL_SOLUTION | Freq: Four times a day (QID) | ORAL | 0 refills | Status: AC | PRN

## 2022-02-20 MED ORDER — CETIRIZINE HCL 10 MG PO TABS: 10.0000 mg | ORAL_TABLET | Freq: Every day | ORAL | 0 refills | Status: AC

## 2022-02-20 MED ORDER — FLUTICASONE PROPIONATE 50 MCG/ACT NA SUSP: 2.0000 | Freq: Every day | NASAL | 0 refills | Status: AC
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
## 2022-02-20 NOTE — Discharge Instructions (Signed)
Take medication as prescribed. Increase fluids and allow for plenty of rest. Recommend Tylenol or ibuprofen as needed for pain, fever, or general discomfort. Warm salt water gargles 3-4 times daily to help with throat pain or discomfort. Recommend using a humidifier at bedtime during sleep to help with cough and nasal congestion. Sleep elevated on 2 pillows while cough symptoms persist. Follow-up in this clinic or with your primary care physician if your symptoms do not improve within the next 10 to 14 days.Marland Kitchen

## 2022-02-20 NOTE — ED Triage Notes (Signed)
Pt presents with c/o cough and nasal congestion that began on Sunday, home covid test negative

## 2022-08-14 ENCOUNTER — Ambulatory Visit
Admission: EM | Admit: 2022-08-14 | Discharge: 2022-08-14 | Disposition: A | Discharge status: Home / Self Care | Payer: 59 | Attending: Family Medicine | Admitting: Family Medicine

## 2022-08-14 DIAGNOSIS — J069 Acute upper respiratory infection, unspecified: Secondary | ICD-10-CM | POA: Diagnosis present

## 2022-08-14 DIAGNOSIS — Z1152 Encounter for screening for COVID-19: Secondary | ICD-10-CM | POA: Diagnosis not present

## 2022-08-14 LAB — POCT INFLUENZA A/B
Influenza A, POC: NEGATIVE
Influenza B, POC: NEGATIVE

## 2022-08-14 MED ORDER — PROMETHAZINE-DM 6.25-15 MG/5ML PO SYRP: 5.0000 mL | ORAL_SOLUTION | Freq: Four times a day (QID) | ORAL | 0 refills | Status: AC | PRN

## 2022-08-14 MED ORDER — FLUTICASONE PROPIONATE 50 MCG/ACT NA SUSP: 1.0000 | Freq: Two times a day (BID) | NASAL | 2 refills | Status: AC
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
## 2022-08-14 NOTE — ED Triage Notes (Signed)
Pt reports she has been coughing,chest pain due to cough, throat pain, fever, headaches, sweats, chills, and body aches x 2 days.  Pt reports she took covid test but was neg at home.

## 2022-08-15 LAB — SARS CORONAVIRUS 2 (TAT 6-24 HRS): SARS Coronavirus 2: NEGATIVE

## 2023-09-15 ENCOUNTER — Ambulatory Visit
Admission: EM | Admit: 2023-09-15 | Discharge: 2023-09-15 | Disposition: A | Discharge status: Home / Self Care | Attending: Family Medicine | Admitting: Family Medicine

## 2023-09-15 DIAGNOSIS — J208 Acute bronchitis due to other specified organisms: Secondary | ICD-10-CM

## 2023-09-15 DIAGNOSIS — J3089 Other allergic rhinitis: Secondary | ICD-10-CM | POA: Diagnosis not present

## 2023-09-15 LAB — POC COVID19/FLU A&B COMBO
Covid Antigen, POC: NEGATIVE
Influenza A Antigen, POC: NEGATIVE
Influenza B Antigen, POC: NEGATIVE

## 2023-09-15 MED ORDER — PREDNISONE 20 MG PO TABS: 40.0000 mg | ORAL_TABLET | Freq: Every day | ORAL | 0 refills | Status: AC

## 2023-09-15 MED ORDER — PROMETHAZINE-DM 6.25-15 MG/5ML PO SYRP: 5.0000 mL | ORAL_SOLUTION | Freq: Four times a day (QID) | ORAL | 0 refills | Status: AC | PRN

## 2023-09-15 NOTE — ED Triage Notes (Signed)
 Pt reports sinus sx's, cough, congestion, body aches, diarrhea, abdominal pain, nausea, onset this morning.
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
